# Patient Record
Sex: Female | Born: 1995 | Race: White | Hispanic: No | Smoking: Never smoker
Health system: Southern US, Community
[De-identification: ages and names within clinical notes are randomized; demographics above are authoritative.]

## PROBLEM LIST (undated history)

## (undated) DIAGNOSIS — S3981XA Other specified injuries of abdomen, initial encounter: Secondary | ICD-10-CM

## (undated) DIAGNOSIS — S060X9A Concussion with loss of consciousness of unspecified duration, initial encounter: Secondary | ICD-10-CM

## (undated) HISTORY — PX: OTHER SURGICAL HISTORY: SHX169

## (undated) HISTORY — PX: TONSILLECTOMY: SUR1361

## (undated) HISTORY — DX: Other specified injuries of abdomen, initial encounter: S39.81XA

## (undated) HISTORY — DX: Concussion with loss of consciousness of unspecified duration, initial encounter: S06.0X9A

---

## 2017-08-14 ENCOUNTER — Inpatient Hospital Stay (EMERGENCY_DEPARTMENT_HOSPITAL)
Admission: AD | Admit: 2017-08-14 | Discharge: 2017-08-15 | Disposition: A | Discharge status: Home / Self Care | Payer: BC Managed Care – PPO | Source: Ambulatory Visit | Attending: Family Medicine | Admitting: Family Medicine

## 2017-08-14 ENCOUNTER — Encounter (HOSPITAL_COMMUNITY): Payer: Self-pay | Admitting: *Deleted

## 2017-08-14 DIAGNOSIS — R102 Pelvic and perineal pain: Secondary | ICD-10-CM | POA: Diagnosis not present

## 2017-08-14 LAB — POCT PREGNANCY, URINE: Preg Test, Ur: NEGATIVE

## 2017-08-14 LAB — URINALYSIS, ROUTINE W REFLEX MICROSCOPIC
BACTERIA UA: NONE SEEN
Bilirubin Urine: NEGATIVE
Glucose, UA: NEGATIVE mg/dL
Hgb urine dipstick: NEGATIVE
Ketones, ur: NEGATIVE mg/dL
NITRITE: NEGATIVE
PH: 6 (ref 5.0–8.0)
Protein, ur: NEGATIVE mg/dL
RBC / HPF: NONE SEEN RBC/hpf (ref 0–5)
SPECIFIC GRAVITY, URINE: 1.001 — AB (ref 1.005–1.030)

## 2017-08-14 MED ORDER — IBUPROFEN 800 MG PO TABS
800.0000 mg | ORAL_TABLET | Freq: Once | ORAL | Status: AC
  Administered 2017-08-15: 800 mg via ORAL
  Filled 2017-08-14: qty 1

## 2017-08-14 MED ORDER — IBUPROFEN 600 MG PO TABS: 600.0000 mg | ORAL_TABLET | Freq: Four times a day (QID) | ORAL | 0 refills | Status: DC | PRN

## 2017-08-14 NOTE — MAU Provider Note (Signed)
  History     CSN: [id number]  Arrival date and time: 08/14/17 1856   None     Chief Complaint  Patient presents with  . Abdominal Pain   HPI Patient is a G0 with 1 month of dull aching right pelvic pain without radiation. Worse with sitting and some movements, but overall fairly mild. Pain worsened over the past three days - now intermittently sharp, but still no radiation. She does feel balloted, but no fevers, chills, nausea, vomiting. Denies new sexual partners. Denies abnormal vaginal discharge. Is about to start her period. Fiance has CF, so is pretty certain that she isn't pregnant. Not currently on OCPs - using condoms.  She just moved to the area, does not have a Gyn. Did call around to try to get into an office, but no new appointments to be seen fairly urgently.    OB History    Gravida Para Term Preterm AB Living   0 0 0 0 0 0   SAB TAB Ectopic Multiple Live Births   0 0 0 0 0      No past medical history on file.  No past surgical history on file.  No family history on file.   Social History   Tobacco Use  . Smoking status: Not on file  Substance Use Topics  . Alcohol use: Not on file  . Drug use: Not on file    Allergies: Allergies not on file  No medications prior to admission.    Review of Systems Physical Exam   Blood pressure 119/72, pulse 94, temperature 98.5 F (36.9 C), temperature source Oral, resp. rate 18, height 5\' 10"  (1.778 m), weight 140 lb 12 oz (63.8 kg), last menstrual period 07/21/2017.  Physical Exam  Constitutional: She is oriented to person, place, and time. She appears well-developed and well-nourished.  HENT:  Head: Normocephalic and atraumatic.  Right Ear: External ear normal.  Left Ear: External ear normal.  Eyes: Pupils are equal, round, and reactive to light.  Neck: Normal range of motion.  Respiratory: Effort normal. No respiratory distress.  GI: Soft. Bowel sounds are normal. She exhibits no distension and no  mass. There is tenderness (right pelvic tenderness). There is no rebound and no guarding.  McBurny's point negative. Neg Psoas sign. Neg heel strike.  Neurological: She is alert and oriented to person, place, and time. No cranial nerve deficit. She exhibits normal muscle tone. Coordination normal.  Skin: Skin is warm and dry.  Psychiatric: She has a normal mood and affect. Her behavior is normal. Judgment and thought content normal.    MAU Course  Procedures  MDM UA normal. Neg Pregnancy test. Unlikely to be appendicitis as pain not in appropriate area, no fever, nausea, etc.  Assessment and Plan  1. Right pelvic pain. Will schedule for nonemergent US to evaluate for ovarian cyst. Will also prescribe ibuprofen 600mg  q 6 hours. Follow up in office in 1 week (after US). Follow up here if pain worsens, become nauseated or if has fever.  Levie HeritageJacob J Jhana Giarratano 08/14/2017, 11:56 PM

## 2017-08-14 NOTE — Discharge Instructions (Signed)
Ovarian Cyst An ovarian cyst is a fluid-filled sac that forms on an ovary. The ovaries are small organs that produce eggs in women. Various types of cysts can form on the ovaries. Some may cause symptoms and require treatment. Most ovarian cysts go away on their own, are not cancerous (are benign), and do not cause problems. Common types of ovarian cysts include:  Functional (follicle) cysts. ? Occur during the menstrual cycle, and usually go away with the next menstrual cycle if you do not get pregnant. ? Usually cause no symptoms.  Endometriomas. ? Are cysts that form from the tissue that lines the uterus (endometrium). ? Are sometimes called "chocolate cysts" because they become filled with blood that turns brown. ? Can cause pain in the lower abdomen during intercourse and during your period.  Cystadenoma cysts. ? Develop from cells on the outside surface of the ovary. ? Can get very large and cause lower abdomen pain and pain with intercourse. ? Can cause severe pain if they twist or break open (rupture).  Dermoid cysts. ? Are sometimes found in both ovaries. ? May contain different kinds of body tissue, such as skin, teeth, hair, or cartilage. ? Usually do not cause symptoms unless they get very big.  Theca lutein cysts. ? Occur when too much of a certain hormone (human chorionic gonadotropin) is produced and overstimulates the ovaries to produce an egg. ? Are most common after having procedures used to assist with the conception of a baby (in vitro fertilization).  What are the causes? Ovarian cysts may be caused by:  Ovarian hyperstimulation syndrome. This is a condition that can develop from taking fertility medicines. It causes multiple large ovarian cysts to form.  Polycystic ovarian syndrome (PCOS). This is a common hormonal disorder that can cause ovarian cysts, as well as problems with your period or fertility.  What increases the risk? The following factors may make  you more likely to develop ovarian cysts:  Being overweight or obese.  Taking fertility medicines.  Taking certain forms of hormonal birth control.  Smoking.  What are the signs or symptoms? Many ovarian cysts do not cause symptoms. If symptoms are present, they may include:  Pelvic pain or pressure.  Pain in the lower abdomen.  Pain during sex.  Abdominal swelling.  Abnormal menstrual periods.  Increasing pain with menstrual periods.  How is this diagnosed? These cysts are commonly found during a routine pelvic exam. You may have tests to find out more about the cyst, such as:  Ultrasound.  X-ray of the pelvis.  CT scan.  MRI.  Blood tests.  How is this treated? Many ovarian cysts go away on their own without treatment. Your health care provider may want to check your cyst regularly for 2-3 months to see if it changes. If you are in menopause, it is especially important to have your cyst monitored closely because menopausal women have a higher rate of ovarian cancer. When treatment is needed, it may include:  Medicines to help relieve pain.  A procedure to drain the cyst (aspiration).  Surgery to remove the whole cyst.  Hormone treatment or birth control pills. These methods are sometimes used to help dissolve a cyst.  Follow these instructions at home:  Take over-the-counter and prescription medicines only as told by your health care provider.  Do not drive or use heavy machinery while taking prescription pain medicine.  Get regular pelvic exams and Pap tests as often as told by your health care   provider.  Return to your normal activities as told by your health care provider. Ask your health care provider what activities are safe for you.  Do not use any products that contain nicotine or tobacco, such as cigarettes and e-cigarettes. If you need help quitting, ask your health care provider.  Keep all follow-up visits as told by your health care provider.  This is important. Contact a health care provider if:  Your periods are late, irregular, or painful, or they stop.  You have pelvic pain that does not go away.  You have pressure on your bladder or trouble emptying your bladder completely.  You have pain during sex.  You have any of the following in your abdomen: ? A feeling of fullness. ? Pressure. ? Discomfort. ? Pain that does not go away. ? Swelling.  You feel generally ill.  You become constipated.  You lose your appetite.  You develop severe acne.  You start to have more body hair and facial hair.  You are gaining weight or losing weight without changing your exercise and eating habits.  You think you may be pregnant. Get help right away if:  You have abdominal pain that is severe or gets worse.  You cannot eat or drink without vomiting.  You suddenly develop a fever.  Your menstrual period is much heavier than usual. This information is not intended to replace advice given to you by your health care provider. Make sure you discuss any questions you have with your health care provider. Document Released: 09/18/2005 Document Revised: 04/07/2016 Document Reviewed: 02/20/2016 Elsevier Interactive Patient Education  2018 [employer]vier Inc[employer]  

## 2017-08-14 NOTE — MAU Note (Signed)
PT  SAYS SHE HAS RIGHT  SIDED  ABD  PAIN - STARTED ON 10-17 .  NO DR.    HAS CALLED - UNABLE  TO  GET AN APPOINTMENT  .   TOOK ALEVE AT  530PM-  FEELS  WORSE.

## 2017-08-15 ENCOUNTER — Ambulatory Visit (HOSPITAL_BASED_OUTPATIENT_CLINIC_OR_DEPARTMENT_OTHER)
Admission: RE | Admit: 2017-08-15 | Discharge: 2017-08-15 | Disposition: A | Discharge status: Home / Self Care | Payer: BC Managed Care – PPO | Source: Ambulatory Visit | Attending: Family Medicine | Admitting: Family Medicine

## 2017-08-15 ENCOUNTER — Ambulatory Visit (HOSPITAL_BASED_OUTPATIENT_CLINIC_OR_DEPARTMENT_OTHER): Payer: BC Managed Care – PPO

## 2017-08-15 ENCOUNTER — Encounter (HOSPITAL_COMMUNITY): Payer: Self-pay | Admitting: *Deleted

## 2017-08-15 DIAGNOSIS — R102 Pelvic and perineal pain: Secondary | ICD-10-CM | POA: Insufficient documentation

## 2017-08-17 ENCOUNTER — Encounter: Payer: Self-pay | Admitting: Family Medicine

## 2017-08-17 ENCOUNTER — Ambulatory Visit: Payer: BC Managed Care – PPO | Admitting: Family Medicine

## 2017-08-17 VITALS — BP 118/74 | HR 102 | Ht 70.0 in | Wt 140.0 lb

## 2017-08-17 DIAGNOSIS — R102 Pelvic and perineal pain: Secondary | ICD-10-CM | POA: Diagnosis not present

## 2017-08-17 NOTE — Progress Notes (Signed)
   Subjective:    Patient ID: Angelica Yu, female    DOB: 07/04/1996, 21 y.o.   MRN: [id number]  HPI Patient seen for follow-up of left-sided pelvic pain.  She was seen and evaluated by me in the MAU.  Ibuprofen has been helpful.  Had ultrasound yesterday had increased pelvic pain yesterday evening.  Has very mild pain this morning.  Patient to start.  The next 2-3 days.  Ultrasound reviewed: I personally reviewed the ultrasound.  Normal size uterus and ovaries.  No cyst seen.  Endometrial thickness 1.6.  Review of Systems     Objective:   Physical Exam  Constitutional: She is oriented to person, place, and time. She appears well-developed and well-nourished.  Pulmonary/Chest: Effort normal. No respiratory distress.  Abdominal: She exhibits no distension.  Neurological: She is alert and oriented to person, place, and time.  Skin: Skin is warm and dry.  Psychiatric: She has a normal mood and affect. Her behavior is normal. Judgment and thought content normal.      Assessment & Plan:  1. Pelvic pain in female No cyst on ultrasound Shows moderate pelvic fluid -likely physiologic ?  Endometriosis.  Will have patient keep a log of pain -when pain is worse, in particular in relation to cycle. Follow-up in 2 months for annual exam and revisit of pelvic pain

## 2017-08-21 NOTE — Addendum Note (Signed)
Addended by: Levie HeritageSTINSON, Keston Seever J on: 08/21/2017 01:19 PM   Modules accepted: Orders

## 2017-08-21 NOTE — Telephone Encounter (Signed)
Please refer to Dr Ayesha MohairZack Smith for pelvic pain. Order placed. Thank you!

## 2017-08-27 ENCOUNTER — Ambulatory Visit: Payer: BLUE CROSS/BLUE SHIELD | Admitting: Obstetrics & Gynecology

## 2017-09-04 NOTE — Progress Notes (Signed)
Angelica Yu D.O. Angelica Yu [address] [place], [place] [address] Phone: [phone] Subjective:    I'm seeing this patient by the request  of:    CC: Hip/back pain, right upper leg pain  BJY:[id number]:Subjective  Angelica DawleyCourtney Borunda is a 21 y.o. female coming in for pelvic pain. She has been having pain for 2 weeks. She had an US by a gynecologist which was negative. She notices pain when driving moving her foot from the gas pedal to the brake when driving. She has a burning sensation in that groin. She is not as active recently which has seemed to diminish her pain. When she lays on her left side her feels tingling on the right side. Most of her pain is on the right lower abdomen. Denies any radiating pain.   Onset- month ago Location- right lower abdomen Duration- intermittent Character- dull, sharp Aggravating factors- driving Reliving factors-  Therapies tried-  Severity-     No past medical history on file. Past Surgical History:  Procedure Laterality Date  . adnoids    . TONSILLECTOMY     Social History   Socioeconomic History  . Marital status: Significant Other    Spouse name: Not on file  . Number of children: Not on file  . Years of education: Not on file  . Highest education level: Not on file  Social Needs  . Financial resource strain: Not on file  . Food insecurity - worry: Not on file  . Food insecurity - inability: Not on file  . Transportation needs - medical: Not on file  . Transportation needs - non-medical: Not on file  Occupational History  . Not on file  Tobacco Use  . Smoking status: Never Smoker  . Smokeless tobacco: Never Used  Substance and Sexual Activity  . Alcohol use: Not on file    Comment: occ  . Drug use: No  . Sexual activity: Yes    Birth control/protection: Condom  Other Topics Concern  . Not on file  Social History Narrative  . Not on file   No Known Allergies Family History  Problem Relation Age of Onset  . Cancer  Paternal Grandfather   . Cancer Maternal Grandmother        stomach  . Breast cancer Maternal Grandmother   . Diabetes Neg Hx   . Hypertension Neg Hx      Past medical history, social, surgical and family history all reviewed in electronic medical record.  No pertanent information unless stated regarding to the chief complaint.   Review of Systems:Review of systems updated and as accurate as of 09/04/17  No headache, visual changes, nausea, vomiting, diarrhea, constipation, dizziness, abdominal pain, skin rash, fevers, chills, night sweats, weight loss, swollen lymph nodes, body aches, joint swelling, muscle aches, chest pain, shortness of breath, mood changes.   Objective  There were no vitals taken for this visit. Systems examined below as of 09/04/17   General: No apparent distress alert and oriented x3 mood and affect normal, dressed appropriately.  HEENT: Pupils equal, extraocular movements intact  Respiratory: Patient's speak in full sentences and does not appear short of breath  Cardiovascular: No lower extremity edema, non tender, no erythema  Skin: Warm dry intact with no signs of infection or rash on extremities or on axial skeleton.  Abdomen: Soft patient's tenderness seems to be in the right lower quadrant.  No rebound.  Patient does have pain mostly right where the pubic musculature in the rectus  abdominis comes together.  No bulge noted with Valsalva Neuro: Cranial nerves II through XII are intact, neurovascularly intact in all extremities with 2+ DTRs and 2+ pulses.  Lymph: No lymphadenopathy of posterior or anterior cervical chain or axillae bilaterally.  Gait normal with good balance and coordination.  MSK:  Non tender with full range of motion and good stability and symmetric strength and tone of shoulders, elbows, wrist, hip, knee and ankles bilaterally.   Limited musculoskeletal ultrasound was performed and interpreted by Angelica SaaZachary M Breylin Yu  Limited ultrasound of  patient's abdominal region does show what appears to be a neurovascular bundle that seems to be hypoechoic changes and increasing Doppler flow.  No true abdominal wall defect noted. Impression: Possible sports hernia    Impression and Recommendations:     This case required medical decision making of moderate complexity.      Note: This dictation was prepared with Dragon dictation along with smaller phrase technology. Any transcriptional errors that result from this process are unintentional.

## 2017-09-05 ENCOUNTER — Encounter: Payer: Self-pay | Admitting: Family Medicine

## 2017-09-05 ENCOUNTER — Ambulatory Visit: Payer: BC Managed Care – PPO | Admitting: Family Medicine

## 2017-09-05 DIAGNOSIS — S3981XA Other specified injuries of abdomen, initial encounter: Secondary | ICD-10-CM | POA: Diagnosis not present

## 2017-09-05 MED ORDER — PREDNISONE 50 MG PO TABS: 50.0000 mg | ORAL_TABLET | Freq: Every day | ORAL | 0 refills | Status: DC

## 2017-09-05 MED ORDER — GABAPENTIN 100 MG PO CAPS: 200.0000 mg | ORAL_CAPSULE | Freq: Every day | ORAL | 3 refills | Status: DC

## 2017-09-05 NOTE — Assessment & Plan Note (Signed)
Patient signs and symptoms seem to be more secondary to a sports hernia.  I believe the patient was doing too much core strength with no significant recovery.  Differential includes a possible hip flexor tendinitis.  Possible abscess formation but with patient intravaginally and no systemic findings I think that this is extremely low likelihood.  Do not feel that advanced imaging is necessary.  We discussed different medications and started on prednisone as well as

## 2017-09-05 NOTE — Patient Instructions (Signed)
Happy [place] OuchBirthday Likely a sports hernia Will take some time Prednisone daily for 5 days Gabapentin 1-2 pills at night if needed Only biking or elliptical with no arms Will take another 6 weeks or so See me again though in 3-4 weeks

## 2017-09-06 ENCOUNTER — Telehealth: Payer: Self-pay | Admitting: Family Medicine

## 2017-09-06 MED ORDER — MELOXICAM 15 MG PO TABS: 15.0000 mg | ORAL_TABLET | Freq: Every day | ORAL | 0 refills | Status: DC

## 2017-09-06 NOTE — Addendum Note (Signed)
Addended by: Judi SaaSMITH, Ambyr Qadri M on: 09/06/2017 01:20 PM   Modules accepted: Orders

## 2017-09-06 NOTE — Telephone Encounter (Signed)
Copied from CRM [id number]. Topic: Quick Communication - See Telephone Encounter >> Sep 06, 2017 10:50 AM Jolayne Hainesaylor, Brittany L wrote: CRM for notification. See Telephone encounter for:  Pt said she was prescribed prednisone yesterday & had a bad reaction to it. She said she seen a dr at her work this am. She took the first dose at 8am this morning. She was triaged at work. She said she does not want to take anymore of it & would like someone to call her back [phone] 09/06/17.

## 2017-09-06 NOTE — Telephone Encounter (Signed)
I will send in meloxicam for her tell her to take it for 10 days then as needed.  Please call and tell her

## 2017-09-06 NOTE — Telephone Encounter (Signed)
Pt. Reports physician at work told her to stop prednisone. She wants to know should she be taking something else. Please advise.

## 2017-10-03 NOTE — Progress Notes (Signed)
[facility] [address] [place], [place] [address] Phone: [phone] Subjective:       CC: Pelvic pain follow-up  VFI:[id number]  Angelica Yu is a 22 y.o. female coming in with complaint of low back pain follow-up.  Seem to have more of a sports hernia.  Given gabapentin, meloxicam and prednisone.  Given home exercise.  Patient states that she hasn't worked out a lot since last visit. She did try the elliptical once and that did cause a lot of pain afterwards. Her day to day pain has decreased. She has done the rehab exercises which have seemed to help. She did drive 10 hours yesterday and does have pain today. Meloxicam did seem to alleviate her pain as well but she has discontinued use until further instruction.        No past medical history on file. Past Surgical History:  Procedure Laterality Date  . adnoids    . TONSILLECTOMY     Social History   Socioeconomic History  . Marital status: Significant Other    Spouse name: None  . Number of children: None  . Years of education: None  . Highest education level: None  Social Needs  . Financial resource strain: None  . Food insecurity - worry: None  . Food insecurity - inability: None  . Transportation needs - medical: None  . Transportation needs - non-medical: None  Occupational History  . None  Tobacco Use  . Smoking status: Never Smoker  . Smokeless tobacco: Never Used  Substance and Sexual Activity  . Alcohol use: None    Comment: occ  . Drug use: No  . Sexual activity: Yes    Birth control/protection: Condom  Other Topics Concern  . None  Social History Narrative  . None   No Known Allergies Family History  Problem Relation Age of Onset  . Cancer Paternal Grandfather   . Cancer Maternal Grandmother        stomach  . Breast cancer Maternal Grandmother   . Diabetes Neg Hx   . Hypertension Neg Hx      Past medical history, social, surgical and family history all  reviewed in electronic medical record.  No pertanent information unless stated regarding to the chief complaint.   Review of Systems:Review of systems updated and as accurate as of 10/04/17  No headache, visual changes, nausea, vomiting, diarrhea, constipation, dizziness, abdominal pain, skin rash, fevers, chills, night sweats, weight loss, swollen lymph nodes, body aches, joint swelling,  chest pain, shortness of breath, mood changes.  Positive muscle aches  Objective  Blood pressure 108/74, pulse 98, height 5\' 10"  (1.778 m), weight 141 lb (64 kg), SpO2 98 %. Systems examined below as of 10/04/17   General: No apparent distress alert and oriented x3 mood and affect normal, dressed appropriately.  HEENT: Pupils equal, extraocular movements intact  Respiratory: Patient's speak in full sentences and does not appear short of breath  Cardiovascular: No lower extremity edema, non tender, no erythema  Skin: Warm dry intact with no signs of infection or rash on extremities or on axial skeleton.  Abdomen: Soft patient is still tender on the right side where the rectus abdominis and the oblique muscles come together.  Negative rebound.  Negative pain to percussion.  No masses appreciated Neuro: Cranial nerves II through XII are intact, neurovascularly intact in all extremities with 2+ DTRs and 2+ pulses.  Lymph: No lymphadenopathy of posterior or anterior cervical chain or axillae  bilaterally.  Gait normal with good balance and coordination.  MSK:  Non tender with full range of motion and good stability and symmetric strength and tone of shoulders, elbows, wrist, hip, knee and ankles bilaterally.   Limited musculoskeletal ultrasound was performed and interpreted by Judi SaaZachary M Smith  Limited ultrasound to the area where the likely sports hernia was previously does show significant decrease in hypoechoic changes and Doppler flow that was seen previously.  With Valsalva no neurovascular bundle seems to go  within the muscle region. Impression: Improvement from previous exam     Impression and Recommendations:     This case required medical decision making of moderate complexity.      Note: This dictation was prepared with Dragon dictation along with smaller phrase technology. Any transcriptional errors that result from this process are unintentional.

## 2017-10-04 ENCOUNTER — Ambulatory Visit: Payer: Self-pay

## 2017-10-04 ENCOUNTER — Encounter: Payer: Self-pay | Admitting: Family Medicine

## 2017-10-04 ENCOUNTER — Ambulatory Visit: Payer: BLUE CROSS/BLUE SHIELD | Admitting: Family Medicine

## 2017-10-04 VITALS — BP 108/74 | HR 98 | Ht 70.0 in | Wt 141.0 lb

## 2017-10-04 DIAGNOSIS — S3981XD Other specified injuries of abdomen, subsequent encounter: Secondary | ICD-10-CM

## 2017-10-04 NOTE — Assessment & Plan Note (Signed)
Patient seems to be making some improvement.  Has had already other pelvic pathology.  Patient is about 70% better at this time.  Encouraged to take the meloxicam as needed and increase activity slowly.  We discussed the possibility of the gabapentin at bedtime which she has not been taking.  We discussed icing regimen.  Patient does not want to start the nitroglycerin.  Follow-up again in 4 weeks

## 2017-10-04 NOTE — Patient Instructions (Signed)
Good news overall  Ice is your friend when needed Ok elliptical, biking and swimming No running or jumping Increase our exercises to 4-5 times a week  About a month out from everything If any worsening pain do the meloxicam daily for 5 days then discontinue.  See me again in 4 weeks

## 2017-10-23 DIAGNOSIS — S060X9A Concussion with loss of consciousness of unspecified duration, initial encounter: Secondary | ICD-10-CM

## 2017-10-23 DIAGNOSIS — S060XAA Concussion with loss of consciousness status unknown, initial encounter: Secondary | ICD-10-CM

## 2017-10-23 HISTORY — DX: Concussion with loss of consciousness of unspecified duration, initial encounter: S06.0X9A

## 2017-10-23 HISTORY — DX: Concussion with loss of consciousness status unknown, initial encounter: S06.0XAA

## 2017-10-31 NOTE — Progress Notes (Signed)
Tawana ScaleZach Mackay Hanauer D.O. [facility] [address] [place], [place] [address] Phone: [phone] Subjective:    I'm seeing this patient by the request  of:    CC: abdominal pain follow up   BJY:NWGNFAOZHYHPI:Subjective  Angelica DawleyCourtney Yu is a 22 y.o. female coming in with complaint of leg and abdominal pain.  Found to have what appeared to be more of a sports hernia.  Patient was having less dated a pain and seemed to be improving but very slowly.  Patient states doing much better at this time.  95% better.  Not noticing any pain and regular exercises.     No past medical history on file. Past Surgical History:  Procedure Laterality Date  . adnoids    . TONSILLECTOMY     Social History   Socioeconomic History  . Marital status: Significant Other    Spouse name: Not on file  . Number of children: Not on file  . Years of education: Not on file  . Highest education level: Not on file  Social Needs  . Financial resource strain: Not on file  . Food insecurity - worry: Not on file  . Food insecurity - inability: Not on file  . Transportation needs - medical: Not on file  . Transportation needs - non-medical: Not on file  Occupational History  . Not on file  Tobacco Use  . Smoking status: Never Smoker  . Smokeless tobacco: Never Used  Substance and Sexual Activity  . Alcohol use: Not on file    Comment: occ  . Drug use: No  . Sexual activity: Yes    Birth control/protection: Condom  Other Topics Concern  . Not on file  Social History Narrative  . Not on file   No Known Allergies Family History  Problem Relation Age of Onset  . Cancer Paternal Grandfather   . Cancer Maternal Grandmother        stomach  . Breast cancer Maternal Grandmother   . Diabetes Neg Hx   . Hypertension Neg Hx      Past medical history, social, surgical and family history all reviewed in electronic medical record.  No pertanent information unless stated regarding to the chief complaint.   Review of  Systems:Review of systems updated and as accurate as of 11/01/17  No  visual changes, nausea, vomiting, diarrhea, constipation, dizziness, abdominal pain, skin rash, fevers, chills, night sweats, weight loss, swollen lymph nodes, body aches, joint swelling, muscle aches, chest pain, shortness of breath, mood changes.  Positive headache but being treated for concussion from an outside facility.  Objective  Blood pressure 130/70, height 5\' 10"  (1.778 m), weight 141 lb (64 kg). Systems examined below as of 11/01/17   General: No apparent distress alert and oriented x3 mood and affect normal, dressed appropriately.  HEENT: Pupils equal, extraocular movements intact nystagmus noted. Respiratory: Patient's speak in full sentences and does not appear short of breath  Cardiovascular: No lower extremity edema, non tender, no erythema  Skin: Warm dry intact with no signs of infection or rash on extremities or on axial skeleton.  Abdomen: Soft nontender  Neuro: Cranial nerves II through XII are intact, neurovascularly intact in all extremities with 2+ DTRs and 2+ pulses.  Lymph: No lymphadenopathy of posterior or anterior cervical chain or axillae bilaterally.  Gait normal with good balance and coordination.  MSK:  Non tender with full range of motion and good stability and symmetric strength and tone of shoulders, elbows, wrist, hip, knee  and ankles bilaterally.     Impression and Recommendations:     This case required medical decision making of moderate complexity.      Note: This dictation was prepared with Dragon dictation along with smaller phrase technology. Any transcriptional errors that result from this process are unintentional.

## 2017-11-01 ENCOUNTER — Ambulatory Visit: Payer: BLUE CROSS/BLUE SHIELD | Admitting: Family Medicine

## 2017-11-01 ENCOUNTER — Encounter: Payer: Self-pay | Admitting: Family Medicine

## 2017-11-01 DIAGNOSIS — S3981XA Other specified injuries of abdomen, initial encounter: Secondary | ICD-10-CM

## 2017-11-01 NOTE — Assessment & Plan Note (Signed)
Much improved at this time.  No significant pain.  Discussed icing regimen and home exercises.  Patient will follow up with me again 6 weeks if not completely resolved exercise progression given

## 2017-11-01 NOTE — Patient Instructions (Signed)
Good to see you  Fish oil 3 grams daily for 10 days and then 2 grams daily thereafter Tart cherry extract for nighttime and help sleep  Vitamin D 2000 IU daily  CoQ10 400mg  daily

## 2017-11-08 ENCOUNTER — Encounter: Payer: Self-pay | Admitting: Family Medicine

## 2017-11-08 ENCOUNTER — Ambulatory Visit (INDEPENDENT_AMBULATORY_CARE_PROVIDER_SITE_OTHER): Payer: BLUE CROSS/BLUE SHIELD | Admitting: Family Medicine

## 2017-11-08 VITALS — BP 123/77 | HR 106 | Ht 70.0 in | Wt 143.0 lb

## 2017-11-08 DIAGNOSIS — Z113 Encounter for screening for infections with a predominantly sexual mode of transmission: Secondary | ICD-10-CM

## 2017-11-08 DIAGNOSIS — Z124 Encounter for screening for malignant neoplasm of cervix: Secondary | ICD-10-CM

## 2017-11-08 DIAGNOSIS — Z01419 Encounter for gynecological examination (general) (routine) without abnormal findings: Secondary | ICD-10-CM | POA: Diagnosis not present

## 2017-11-08 NOTE — Progress Notes (Signed)
GYNECOLOGY ANNUAL PREVENTATIVE CARE ENCOUNTER NOTE  Subjective:   Angelica Yu is a 22 y.o. G0P0000 female here for a routine annual gynecologic exam.  Current complaints: none.   Denies abnormal vaginal bleeding, discharge, pelvic pain, problems with intercourse or other gynecologic concerns.    Patient's partner has CF - wants to look into testing. Information given.  Gynecologic History Patient's last menstrual period was 10/21/2017. Patient is sexually active  Contraception: condoms Last Pap: n/a. Last mammogram: n/a.  Obstetric History OB History  Gravida Para Term Preterm AB Living  0 0 0 0 0 0  SAB TAB Ectopic Multiple Live Births  0 0 0 0 0        Past Medical History:  Diagnosis Date  . Sports hernia     Past Surgical History:  Procedure Laterality Date  . adnoids    . TONSILLECTOMY      Current Outpatient Medications on File Prior to Visit  Medication Sig Dispense Refill  . ibuprofen (ADVIL,MOTRIN) 600 MG tablet Take 1 tablet (600 mg total) every 6 (six) hours as needed by mouth. 30 tablet 0  . meloxicam (MOBIC) 15 MG tablet Take 1 tablet (15 mg total) by mouth daily. 30 tablet 0  . gabapentin (NEURONTIN) 100 MG capsule Take 2 capsules (200 mg total) by mouth at bedtime. (Patient not taking: Reported on 11/01/2017) 60 capsule 3  . predniSONE (DELTASONE) 50 MG tablet Take 1 tablet (50 mg total) by mouth daily. (Patient not taking: Reported on 11/01/2017) 5 tablet 0   No current facility-administered medications on file prior to visit.     No Known Allergies  Social History   Socioeconomic History  . Marital status: Significant Other    Spouse name: Not on file  . Number of children: Not on file  . Years of education: Not on file  . Highest education level: Not on file  Social Needs  . Financial resource strain: Not on file  . Food insecurity - worry: Not on file  . Food insecurity - inability: Not on file  . Transportation needs - medical:  Not on file  . Transportation needs - non-medical: Not on file  Occupational History  . Not on file  Tobacco Use  . Smoking status: Never Smoker  . Smokeless tobacco: Never Used  Substance and Sexual Activity  . Alcohol use: Not on file    Comment: occ  . Drug use: No  . Sexual activity: Yes    Birth control/protection: Condom  Other Topics Concern  . Not on file  Social History Narrative  . Not on file    Family History  Problem Relation Age of Onset  . Cancer Paternal Grandfather   . Cancer Maternal Grandmother        stomach  . Breast cancer Maternal Grandmother   . Diabetes Neg Hx   . Hypertension Neg Hx     The following portions of the patient's history were reviewed and updated as appropriate: allergies, current medications, past family history, past medical history, past social history, past surgical history and problem list.  Review of Systems Pertinent items noted in HPI and remainder of comprehensive ROS otherwise negative.   Objective:  BP 123/77   Pulse (!) 106   Ht 5\' 10"  (1.778 m)   Wt 143 lb (64.9 kg)   LMP 10/21/2017   BMI 20.52 kg/m  CONSTITUTIONAL: Well-developed, well-nourished female in no acute distress.  HENT:  Normocephalic, atraumatic, External right and left  ear normal. Oropharynx is clear and moist EYES: Conjunctivae and EOM are normal. Pupils are equal, round, and reactive to light. No scleral icterus.  NECK: Normal range of motion, supple, no masses.  Normal thyroid.   CARDIOVASCULAR: Normal heart rate noted, regular rhythm RESPIRATORY: Clear to auscultation bilaterally. Effort and breath sounds normal, no problems with respiration noted. BREASTS: Symmetric in size. No masses, skin changes, nipple drainage, or lymphadenopathy. ABDOMEN: Soft, normal bowel sounds, no distention noted.  No tenderness, rebound or guarding.  PELVIC: Normal appearing external genitalia; normal appearing vaginal mucosa and cervix.  No abnormal discharge noted.   Pap smear obtained.  Normal uterine size, no other palpable masses, no uterine or adnexal tenderness. MUSCULOSKELETAL: Normal range of motion. No tenderness.  No cyanosis, clubbing, or edema.  2+ distal pulses. SKIN: Skin is warm and dry. No rash noted. Not diaphoretic. No erythema. No pallor. NEUROLOGIC: Alert and oriented to person, place, and time. Normal reflexes, muscle tone coordination. No cranial nerve deficit noted. PSYCHIATRIC: Normal mood and affect. Normal behavior. Normal judgment and thought content.  Assessment:  Annual gynecologic examination with pap smear   Plan:  Will follow up results of pap smear and manage accordingly. STD testing discussed. Patient declined testing Discussed exercise and diet   Routine preventative health maintenance measures emphasized. Please refer to After Visit Summary for other counseling recommendations.    Candelaria CelesteJacob Obdulia Steier, DO [facility]

## 2017-11-08 NOTE — Patient Instructions (Signed)

## 2017-11-12 LAB — CYTOLOGY - PAP
CHLAMYDIA, DNA PROBE: NEGATIVE
DIAGNOSIS: NEGATIVE
NEISSERIA GONORRHEA: NEGATIVE

## 2017-11-13 ENCOUNTER — Encounter: Payer: Self-pay | Admitting: Family Medicine

## 2017-12-13 ENCOUNTER — Ambulatory Visit: Payer: BLUE CROSS/BLUE SHIELD | Admitting: Family Medicine

## 2018-01-01 ENCOUNTER — Encounter
Payer: No Typology Code available for payment source | Attending: Physical Medicine & Rehabilitation | Admitting: Physical Medicine & Rehabilitation

## 2018-01-01 ENCOUNTER — Encounter: Payer: Self-pay | Admitting: Physical Medicine & Rehabilitation

## 2018-01-01 DIAGNOSIS — G479 Sleep disorder, unspecified: Secondary | ICD-10-CM | POA: Insufficient documentation

## 2018-01-01 DIAGNOSIS — H8113 Benign paroxysmal vertigo, bilateral: Secondary | ICD-10-CM | POA: Diagnosis not present

## 2018-01-01 DIAGNOSIS — R51 Headache: Secondary | ICD-10-CM | POA: Insufficient documentation

## 2018-01-01 DIAGNOSIS — G44309 Post-traumatic headache, unspecified, not intractable: Secondary | ICD-10-CM

## 2018-01-01 DIAGNOSIS — F0781 Postconcussional syndrome: Secondary | ICD-10-CM

## 2018-01-01 DIAGNOSIS — H8110 Benign paroxysmal vertigo, unspecified ear: Secondary | ICD-10-CM | POA: Diagnosis not present

## 2018-01-01 MED ORDER — TOPIRAMATE 25 MG PO TABS: 25.0000 mg | ORAL_TABLET | Freq: Every day | ORAL | 2 refills | Status: DC

## 2018-01-01 NOTE — Patient Instructions (Signed)
TOPAMAX AT NIGHT.   GOAL ----10 HOURS OF SLEEP   YOU MAY CONTINUE TO WORK 4 HOURS PER DAY.      PLEASE FEEL FREE TO CALL OUR OFFICE WITH ANY PROBLEMS OR QUESTIONS [phone])

## 2018-01-01 NOTE — Progress Notes (Signed)
Subjective:    Patient ID: Angelica Yu, female    DOB: 11/24/1995, 22 y.o.   MRN: [id number]  HPI   Angelica Yu is here for an initial evaluation of post-concussion syndrome. On 10/23/17 she was bending over underneath a cabinet at work.  When she stood up she hit her head forcibly along the underside of the cabinet door striking the area behind her right ear along the temporal occipital region.  She did not lose consciousness but did have an ongoing headache and felt off balance.  She was seen by the medical team at [facility] and conservative care was recommended.  She was given meclizine for vertigo which she took briefly.  No imaging was done of her brain.  She was showing gradual improvement with her work hours being slowly increased over the last few weeks. 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 Since that time she feels as if she has regressed, often being unable to perform simple tasks at home due to headaches and dizziness, fatigue. Her dizziness has really has been intermittent since the original accident with occasional associated nausea, especially early on.  Also of note, she feels a echoing/fullness in her right ear since the injury. She has also noticed photo-sensitivity and can't drive at night. Diplopia is better.  Her right scalp behind her ear also remains tender  She has also noticed more difficulties with concentration, focus. She is not processing as quickly as she once did.  She notes that she is able to maintain the responsibilities of her job and that others have not noticed her deficits.  Her sleep has been a problem, often due to her headaches.  She has more recently had problems falling to sleep. She sleeps around 9 hours but doesn't feel that she has much energy during the day.  She is having to take naps during the day to help get herself through.   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    Pain Inventory Average Pain 4 Pain Right Now 7 My pain is sharp and stabbing  In the last 24 hours, has pain interfered with the following? General activity 8 Relation with others 2 Enjoyment of life 6 What TIME of day is your pain at its worst? evening Sleep (in general) Good  Pain is worse with: na Pain improves with: rest Relief from Meds: 0  Mobility walk without assistance ability to climb steps?  yes do you drive?  yes  Function employed # of hrs/week 40  Neuro/Psych tingling dizziness confusion anxiety  Prior Studies Any changes since last visit?  no  Physicians involved in your care Any changes since last visit?  no   Family History  Problem Relation Age of Onset  . Cancer Paternal Grandfather   . Cancer Maternal Grandmother        stomach  . Breast cancer Maternal Grandmother   . Diabetes Neg Hx   . Hypertension Neg Hx    Social History   Socioeconomic History  . Marital status: Significant Other    Spouse name: Not on file  . Number of children: Not on file  . Years of education: Not on file  . Highest education level: Not on file  Occupational History  . Not on file  Social Needs  .  Financial resource strain: Not on file  . Food insecurity:    Worry: Not on file    Inability: Not on file  . Transportation needs:    Medical: Not on file    Non-medical: Not on file  Tobacco Use  . Smoking status: Never Smoker  . Smokeless tobacco: Never Used  Substance and Sexual Activity  . Alcohol use: Not Currently  . Drug use: No  . Sexual activity: Yes    Birth control/protection: Condom  Lifestyle  . Physical activity:    Days per week: Not on file    Minutes per session: Not on file  . Stress: Not on file  Relationships  . Social connections:    Talks on phone: Not on file    Gets together: Not on file    Attends religious  service: Not on file    Active member of club or organization: Not on file    Attends meetings of clubs or organizations: Not on file    Relationship status: Not on file  Other Topics Concern  . Not on file  Social History Narrative  . Not on file   Past Surgical History:  Procedure Laterality Date  . adnoids    . TONSILLECTOMY     Past Medical History:  Diagnosis Date  . Sports hernia    BP 123/87   Pulse (!) 125   Ht 5\' 10"  (1.778 m) Comment: states  Wt 140 lb (63.5 kg) Comment: states  LMP 12/19/2017   SpO2 98%   BMI 20.09 kg/m   Opioid Risk Score:   Fall Risk Score:  `1  Depression screen PHQ 2/9  No flowsheet data found.   Review of Systems  Constitutional: Negative.   HENT: Negative.   Eyes: Negative.   Respiratory: Negative.   Cardiovascular: Negative.   Gastrointestinal: Negative.   Endocrine: Negative.   Genitourinary: Negative.   Musculoskeletal: Negative.   Skin: Negative.   Allergic/Immunologic: Negative.   Neurological: Positive for dizziness and headaches.  Hematological: Negative.   Psychiatric/Behavioral: Negative.   All other systems reviewed and are negative.      Objective:   Physical Exam  General: Alert and oriented x 3, No apparent distress.  Perhaps a bit photosensitive HEENT: Head is normocephalic, atraumatic, PERRLA, EOMI, sclera anicteric, oral mucosa pink and moist, dentition intact, right ext ear canal clear,  Neck: Supple without JVD or lymphadenopathy Heart: Reg rate and rhythm. No murmurs rubs or gallops Chest: CTA bilaterally without wheezes, rales, or rhonchi; no distress Abdomen: Soft, non-tender, non-distended, bowel sounds positive. Extremities: No clubbing, cyanosis, or edema. Pulses are 2+ Skin: Clean and intact without signs of breakdown Neuro: Patient displays intact insight and awareness and functional attention.  She recalled 3 out of 3 words after 5 minutes.  She is able to sequence numbers without too much  difficulty.  She is aware of current events and abstract thinking was appropriate.  She is able to spell the word world forward and backwards.  He she did struggle a bit with serial sevens missing 1 out of the 3 numbers.  During visual confrontation and Hallpike Dix maneuvers I elicited nystagmus to the right and to the left.  Fine motor coordination was intact although Romberg testing was positive for posterior lean.. Sensory exam is normal. Reflexes are 2+ in all 4's.  No tremors. Motor function is grossly 5/5.  Gait was fairly normal Musculoskeletal: Full ROM, No pain with AROM or PROM in the neck, trunk,  or extremities. Posture appropriate Psych: Pt's affect is appropriate. Pt is cooperative.  Perhaps a little anxious but very upbeat overall.         Assessment & Plan:  1. 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 No LOC   Plan: 1.  [facility] for vestibular assessment and treatment.  She should respond nicely to this. 2.  For headaches, will begin on Topamax 25 mg nightly.  Can increase further if needed.  If she has breakthrough headaches can consider medication to manage these as well. 3.  Discussed restoration of her sleep cycle.  Her normal sleep is about 9 hours.  She probably needs to target at least 10 to help maximize her function during the day. 4.  Can consider a formal speech therapy evaluation for higher level cognition.  In the meantime, however, she will work on some strategies to help maintain organization and focus during the day.  Along those lines I recommended that she only work 4 hours/day at this point until some of her symptoms improve. 5.  Overall she should do nicely.  I want her to continue acclimating herself from a cognitive and physical standpoint.  She has not had an MRI or CAT scan of her brain.  However,  at this point such as scan would not change the plan of care. 6.  I will see her back in about 1 month's time.  Today I spent 45 minutes in direct patient care.  All questions were encouraged and answered.

## 2018-01-02 ENCOUNTER — Ambulatory Visit: Payer: BLUE CROSS/BLUE SHIELD | Admitting: Nurse Practitioner

## 2018-01-02 ENCOUNTER — Encounter: Payer: Self-pay | Admitting: Nurse Practitioner

## 2018-01-02 VITALS — BP 125/72 | HR 106 | Temp 97.7°F | Ht 70.0 in | Wt 141.4 lb

## 2018-01-02 DIAGNOSIS — Z0000 Encounter for general adult medical examination without abnormal findings: Secondary | ICD-10-CM

## 2018-01-02 DIAGNOSIS — Z23 Encounter for immunization: Secondary | ICD-10-CM

## 2018-01-02 LAB — COMPREHENSIVE METABOLIC PANEL
ALT: 15 U/L (ref 0–35)
AST: 21 U/L (ref 0–37)
Albumin: 4.4 g/dL (ref 3.5–5.2)
Alkaline Phosphatase: 52 U/L (ref 39–117)
BUN: 11 mg/dL (ref 6–23)
CHLORIDE: 102 meq/L (ref 96–112)
CO2: 29 meq/L (ref 19–32)
CREATININE: 0.76 mg/dL (ref 0.40–1.20)
Calcium: 9.9 mg/dL (ref 8.4–10.5)
GFR: 99.96 mL/min (ref >60.00)
Glucose, Bld: 93 mg/dL (ref 70–99)
POTASSIUM: 5 meq/L (ref 3.5–5.1)
SODIUM: 138 meq/L (ref 135–145)
Total Bilirubin: 0.7 mg/dL (ref 0.2–1.2)
Total Protein: 7.2 g/dL (ref 6.0–8.3)

## 2018-01-02 LAB — CBC
HEMATOCRIT: 42.6 % (ref 36.0–46.0)
Hemoglobin: 14.6 g/dL (ref 12.0–15.0)
MCHC: 34.3 g/dL (ref 30.0–36.0)
MCV: 89.9 fl (ref 78.0–100.0)
Platelets: 245 10*3/uL (ref 150.0–400.0)
RBC: 4.74 Mil/uL (ref 3.87–5.11)
RDW: 12.8 % (ref 11.5–15.5)
WBC: 5.6 10*3/uL (ref 4.0–10.5)

## 2018-01-02 LAB — TSH: TSH: 1.03 u[IU]/mL (ref 0.35–4.50)

## 2018-01-02 NOTE — Patient Instructions (Signed)
Go to lab for blood draw  You will be called with results.   Preventive Care for [place], Female The transition to life after high school as a young adult can be a stressful time with many changes. You may start seeing a primary care physician instead of a pediatrician. This is the time when your health care becomes your responsibility. Preventive care refers to lifestyle choices and visits with your health care provider that can promote health and wellness. What does preventive care include?  A yearly physical exam. This is also called an annual wellness visit.  Dental exams once or twice a year.  Routine eye exams. Ask your health care provider how often you should have your eyes checked.  Personal lifestyle choices, including: ? Daily care of your teeth and gums. ? Regular physical activity. ? Eating a healthy diet. ? Avoiding tobacco and drug use. ? Avoiding or limiting alcohol use. ? Practicing safe sex. ? Taking vitamin and mineral supplements as recommended by your health care provider. What happens during an annual wellness visit? Preventive care starts with a yearly visit to your primary care physician. The services and screenings done by your health care provider during your annual wellness visit will depend on your overall health, lifestyle risk factors, and family history of disease. Counseling Your health care provider may ask you questions about:  Past medical problems and your family's medical history.  Medicines or supplements you take.  Health insurance and access to health care.  Alcohol, tobacco, and drug use.  Your safety at home, work, or school.  Access to firearms.  Emotional well-being and how you cope with stress.  Relationship well-being.  Diet, exercise, and sleep habits.  Your sexual health and activity.  Your methods of birth control.  Your menstrual cycle.  Your pregnancy history.  Screening You may have the following tests or  measurements:  Height, weight, and BMI.  Blood pressure.  Lipid and cholesterol levels.  Tuberculosis skin test.  Skin exam.  Vision and hearing tests.  Screening test for hepatitis.  Screening tests for sexually transmitted diseases (STDs), if you are at risk.  BRCA-related cancer screening. This may be done if you have a family history of breast, ovarian, tubal, or peritoneal cancers.  Pelvic exam and Pap test. This may be done every 3 years starting at age 38.  Vaccines Your health care provider may recommend certain vaccines, such as:  Influenza vaccine. This is recommended every year.  Tetanus, diphtheria, and acellular pertussis (Tdap, Td) vaccine. You may need a Td booster every 10 years.  Varicella vaccine. You may need this if you have not been vaccinated.  HPV vaccine. If you are 89 or younger, you may need three doses over 6 months.  Measles, mumps, and rubella (MMR) vaccine. You may need at least one dose of MMR. You may also need a second dose.  Pneumococcal 13-valent conjugate (PCV13) vaccine. You may need this if you have certain conditions and were not previously vaccinated.  Pneumococcal polysaccharide (PPSV23) vaccine. You may need one or two doses if you smoke cigarettes or if you have certain conditions.  Meningococcal vaccine. One dose is recommended if you are age 50-21 years and a first-year college student living in a residence hall, or if you have one of several medical conditions. You may also need additional booster doses.  Hepatitis A vaccine. You may need this if you have certain conditions or if you travel or work in places where you  may be exposed to hepatitis A.  Hepatitis B vaccine. You may need this if you have certain conditions or if you travel or work in places where you may be exposed to hepatitis B.  Haemophilus influenzae type b (Hib) vaccine. You may need this if you have certain risk factors.  Talk to your health care provider  about which screenings and vaccines you need and how often you need them. What steps can I take to develop healthy behaviors?  Have regular preventive health care visits with your primary care physician and dentist.  Eat a healthy diet.  Drink enough fluid to keep your urine clear or pale yellow.  Stay active. Exercise at least 30 minutes 5 or more days of the week.  Use alcohol responsibly.  Maintain a healthy weight.  Do not use any products that contain nicotine, such as cigarettes, chewing tobacco, and e-cigarettes. If you need help quitting, ask your health care provider.  Do not use drugs.  Practice safe sex.  Use birth control (contraception) to prevent unwanted pregnancy. If you plan to become pregnant, see your health care provider for a pre-conception visit.  Find healthy ways to manage stress. How can I protect myself from injury? Injuries from violence or accidents are the leading cause of death among young adults and can often be prevented. Take these steps to help protect yourself:  Always wear your seat belt while driving or riding in a vehicle.  Do not drive if you have been drinking alcohol. Do not ride with someone who has been drinking.  Do not drive when you are tired or distracted. Do not text while driving.  Wear a helmet and other protective equipment during sports activities.  If you have firearms in your house, make sure you follow all gun safety procedures.  Seek help if you have been bullied, physically abused, or sexually abused.  Use the Internet responsibly to avoid dangers such as online bullying and online sexual predators.  What can I do to cope with stress? Young adults may face many new challenges that can be stressful, such as finding a job, going to college, moving away from home, managing money, being in a relationship, getting married, and having children. To manage stress:  Avoid known stressful situations when you can.  Exercise  regularly.  Find a stress-reducing activity that works best for you. Examples include meditation, yoga, listening to music, or reading.  Spend time in nature.  Keep a journal to write about your stress and how you respond.  Talk to your health care provider about stress. He or she may suggest counseling.  Spend time with supportive friends or family.  Do not cope with stress by: ? Drinking alcohol or using drugs. ? Smoking cigarettes. ? Eating.  Where can I get more information? Learn more about preventive care and healthy habits from:  [place] and Gynecologists: KaraokeExchange.nl  U.S. Probation officer Task Force: StageSync.si  National Adolescent and [place]: StrategicRoad.nl  American Academy of Pediatrics Bright Futures: https://brightfutures.MemberVerification.co.za  Society for Adolescent Health and Medicine: MoralBlog.co.za.aspx  PodExchange.nl: ToyLending.fr  This information is not intended to replace advice given to you by your health care provider. Make sure you discuss any questions you have with your health care provider. Document Released: 02/03/2016 Document Revised: 02/24/2016 Document Reviewed: 02/03/2016 Elsevier Interactive Patient Education  [employer].

## 2018-01-02 NOTE — Progress Notes (Signed)
Subjective:  Patient ID: Angelica Dawleyourtney Schamp, female    DOB: 01/28/1996  Age: 22 y.o. MRN: [id number]  CC: Establish Care (est care. would just like to set up care. new to area. Tdap? just had GYN and well visit 2.9.19.)  HPI  Ms. Angelica ReamerShreve is here to establish care and for CPE. 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 She is engaged and no children at this time. Home with Fiance (patrick). Sexually active with use of condoms.  Vision: up to date, done annually.  Dental: up to date, done every 6months.  PAP: up to date.  Outpatient Medications Prior to Visit  Medication Sig Dispense Refill  . Cholecalciferol (VITAMIN D) 2000 units CAPS Take by mouth.    . Multiple Vitamin (MULTIVITAMIN WITH MINERALS) TABS tablet Take 1 tablet by mouth daily.    Marland Kitchen. topiramate (TOPAMAX) 25 MG tablet Take 1 tablet (25 mg total) by mouth at bedtime. 30 tablet 2   No facility-administered medications prior to visit.    Social History   Socioeconomic History  . Marital status: Significant Other    Spouse name: Not on file  . Number of children: Not on file  . Years of education: Not on file  . Highest education level: Not on file  Occupational History  . Not on file  Social Needs  . Financial resource strain: Not on file  . Food insecurity:    Worry: Not on file    Inability: Not on file  . Transportation needs:    Medical: Not on file    Non-medical: Not on file  Tobacco Use  . Smoking status: Never Smoker  . Smokeless tobacco: Never Used  Substance and Sexual Activity  . Alcohol use: Not Currently  . Drug use: No  . Sexual activity: Yes    Birth control/protection: Condom  Lifestyle  . Physical activity:    Days per week: Not on file    Minutes per session: Not on file  . Stress: Not on file  Relationships  . Social connections:    Talks on phone: Not on file    Gets together: Not on file    Attends religious service: Not on file    Active member of club  or organization: Not on file    Attends meetings of clubs or organizations: Not on file    Relationship status: Not on file  . Intimate partner violence:    Fear of current or ex partner: Not on file    Emotionally abused: Not on file    Physically abused: Not on file    Forced sexual activity: Not on file  Other Topics Concern  . Not on file  Social History Narrative  . Not on file   Family History  Problem Relation Age of Onset  . Cancer Paternal Grandfather   . Alcohol abuse Paternal Grandfather   . Cancer Maternal Grandmother        stomach  . Breast cancer Maternal Grandmother [id number]  . Early death Maternal Grandfather   . Kidney disease Paternal Grandmother   . Diabetes Neg Hx   . Hypertension Neg Hx    ROS Review of Systems  Constitutional: Negative for fever, malaise/fatigue and weight loss.  HENT: Negative for congestion and sore throat.   Eyes:       Negative for visual changes  Respiratory: Negative for cough and shortness of breath.   Cardiovascular: Negative for chest pain, palpitations and  leg swelling.  Gastrointestinal: Negative for blood in stool, constipation, diarrhea and heartburn.  Genitourinary: Negative for dysuria, frequency and urgency.  Musculoskeletal: Negative for falls, joint pain and myalgias.  Skin: Negative for rash.  Neurological: Positive for dizziness. Negative for sensory change and headaches.       Persistent intermittent dizziness with movement post concussion. ongoing management by neurology. Reports she has been referred for vestibular rehab.  Endo/Heme/Allergies: Does not bruise/bleed easily.  Psychiatric/Behavioral: Negative for depression, substance abuse and suicidal ideas. The patient is not nervous/anxious.      Objective:  BP 125/72 (BP Location: Left Arm, Patient Position: Sitting, Cuff Size: Normal)   Pulse (!) 106   Temp 97.7 F (36.5 C) (Oral)   Ht 5\' 10"  (1.778 m)   Wt 141 lb 6.4 oz (64.1 kg)   LMP 12/19/2017   SpO2  97%   BMI 20.29 kg/m   BP Readings from Last 3 Encounters:  01/02/18 125/72  01/01/18 118/85  11/08/17 123/77    Wt Readings from Last 3 Encounters:  01/02/18 141 lb 6.4 oz (64.1 kg)  01/01/18 140 lb (63.5 kg)  11/08/17 143 lb (64.9 kg)    Physical Exam  Constitutional: She is oriented to person, place, and time. No distress.  HENT:  Right Ear: External ear normal.  Left Ear: External ear normal.  Nose: Nose normal.  Mouth/Throat: No oropharyngeal exudate.  Eyes: No scleral icterus.  Neck: Normal range of motion. Neck supple.  Cardiovascular: Normal rate, regular rhythm and normal heart sounds.  Pulmonary/Chest: Effort normal and breath sounds normal. No respiratory distress.  Abdominal: Soft. She exhibits no distension.  Genitourinary:  Genitourinary Comments: Breast and pelvic exam defered to GYN  Musculoskeletal: Normal range of motion. She exhibits no edema.  Lymphadenopathy:    She has no cervical adenopathy.  Neurological: She is alert and oriented to person, place, and time.  Skin: Skin is warm and dry.  Psychiatric: She has a normal mood and affect. Her behavior is normal.    No results found for: WBC, HGB, HCT, PLT, GLUCOSE, CHOL, TRIG, HDL, LDLDIRECT, LDLCALC, ALT, AST, NA, K, CL, CREATININE, BUN, CO2, TSH, PSA, INR, GLUF, HGBA1C, MICROALBUR  US Pelvis Transvanginal Non-ob (tv Only)  Result Date: 08/15/2017 CLINICAL DATA:  Initial evaluation for intermittent right pelvic pain for 1-2 weeks. EXAM: TRANSABDOMINAL AND TRANSVAGINAL ULTRASOUND OF PELVIS TECHNIQUE: Both transabdominal and transvaginal ultrasound examinations of the pelvis were performed. Transabdominal technique was performed for global imaging of the pelvis including uterus, ovaries, adnexal regions, and pelvic cul-de-sac. It was necessary to proceed with endovaginal exam following the transabdominal exam to visualize the uterus and ovaries. COMPARISON:  None FINDINGS: Uterus Measurements: 7.1 x 5.3 x  6.4 cm. No fibroids or other mass visualized. Endometrium Thickness: 16.7 mm.  No focal abnormality visualized. Right ovary Measurements: 3.8 x 2.6 x 2.5 cm. Normal appearance/no adnexal mass. Left ovary Measurements: 3.1 x 1.9 x 3.2 cm. Normal appearance/no adnexal mass. Other findings Moderate volume free fluid within the pelvis, likely physiologic. IMPRESSION: 1. Moderate volume free fluid within the pelvis, likely physiologic. 2. Endometrial stripe measures 16.7 mm without focal abnormality, consistent with secretory phase endometrium. 3. Otherwise normal sonographic appearance of the uterus and ovaries. No adnexal mass. Electronically Signed   By: Rise Mu M.D.   On: 08/15/2017 21:15   US Pelvis (transabdominal Only)  Result Date: 08/15/2017 CLINICAL DATA:  Initial evaluation for intermittent right pelvic pain for 1-2 weeks. EXAM: TRANSABDOMINAL AND TRANSVAGINAL ULTRASOUND  OF PELVIS TECHNIQUE: Both transabdominal and transvaginal ultrasound examinations of the pelvis were performed. Transabdominal technique was performed for global imaging of the pelvis including uterus, ovaries, adnexal regions, and pelvic cul-de-sac. It was necessary to proceed with endovaginal exam following the transabdominal exam to visualize the uterus and ovaries. COMPARISON:  None FINDINGS: Uterus Measurements: 7.1 x 5.3 x 6.4 cm. No fibroids or other mass visualized. Endometrium Thickness: 16.7 mm.  No focal abnormality visualized. Right ovary Measurements: 3.8 x 2.6 x 2.5 cm. Normal appearance/no adnexal mass. Left ovary Measurements: 3.1 x 1.9 x 3.2 cm. Normal appearance/no adnexal mass. Other findings Moderate volume free fluid within the pelvis, likely physiologic. IMPRESSION: 1. Moderate volume free fluid within the pelvis, likely physiologic. 2. Endometrial stripe measures 16.7 mm without focal abnormality, consistent with secretory phase endometrium. 3. Otherwise normal sonographic appearance of the uterus and  ovaries. No adnexal mass. Electronically Signed   By: Rise Mu M.D.   On: 08/15/2017 21:15    Assessment & Plan:   Elenor was seen today for establish care.  Diagnoses and all orders for this visit:  Preventative health care -     CBC -     Comprehensive metabolic panel -     TSH  Need for vaccine for DT (diphtheria-tetanus) -     Td : Tetanus/diphtheria >7yo Preservative  free   I am having Angelica Yu maintain her multivitamin with minerals, Vitamin D, and topiramate.  No orders of the defined types were placed in this encounter.   Follow-up: No follow-ups on file.  Alysia Penna, NP

## 2018-01-09 ENCOUNTER — Ambulatory Visit
Payer: No Typology Code available for payment source | Attending: Physical Medicine & Rehabilitation | Admitting: Rehabilitative and Restorative Service Providers"

## 2018-01-09 DIAGNOSIS — H8111 Benign paroxysmal vertigo, right ear: Secondary | ICD-10-CM | POA: Insufficient documentation

## 2018-01-09 DIAGNOSIS — R42 Dizziness and giddiness: Secondary | ICD-10-CM | POA: Insufficient documentation

## 2018-01-09 NOTE — Therapy (Signed)
[facility] [address] [place], [place], [address] Phone: [phone]   Fax:  [phone]  Physical Therapy Evaluation  Patient Details  Name: Angelica Yu MRN: [id number] Date of Birth: 1996/03/27 Referring Provider: Faith Rogue, MD   Encounter Date: 01/09/2018  PT End of Session - 01/09/18 1346    Visit Number  1    Number of Visits  8    Date for PT Re-Evaluation  02/23/18    Authorization Type  worker's comp:  Fax to [employer], claim specalist at [employer] [phone]; Phone number [phone]    PT Start Time  1150    PT Stop Time  1238    PT Time Calculation (min)  48 min    Activity Tolerance  Treatment limited secondary to medical complications (Comment) PT evaluation provokes 9/10 dizziness with nausea    Behavior During Therapy  [facility] for tasks assessed/performed       Past Medical History:  Diagnosis Date  . Concussion 10/23/2017  . Sports hernia     Past Surgical History:  Procedure Laterality Date  . adnoids    . TONSILLECTOMY      There were no vitals filed for this visit.   Subjective Assessment - 01/09/18 1152    Subjective  The patient is s/p concussion 10/23/17 from hitting her head on a cabinet edge when standing.  She has HA, dizziness, light sensitivity, sound sensitivity, sleep difficulties, nausea (no vomitting).   She was evaluated at [facility].  Dizziness got worse at approximately 4 weeks after initial injury.  Current status for work is : 4 hours/day (she regressed last week).  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    Head fog and spinning sensations are worse after screen time.  A good day is 30 minutes of screen time, other days can tolerate 5 minutes.    She reports sensation of orthostatic hypotension (feeling like she could black out when she gets up after sitting for longer periods), and notes dizziness with bed moiblity.  She notes a sensation of  popping in her right ear, and when lying down (feels it when she swallows), she also notes a pulsatile sensation in her right ear with some echoing sensations.  She is avoiding loud environments.     Patient Stated Goals  "Get back to a normal lifestyle".  She notes she has not been able to get back to her activity level due to prior spors hernia and the nconcussion.     Currently in Pain?  Yes    Pain Score  5  4-5/10, noting this is a good day.  She feels she is improving with Topamax.    Pain Location  Head temporal area    Pain Orientation  Right    Pain Descriptors / Indicators  Headache    Pain Type  Acute pain    Pain Onset  More than a month ago    Pain Frequency  Intermittent    Aggravating Factors   lights, sounds    Pain Relieving Factors  topamax seems to be helping         [facility] PT Assessment - 01/09/18 1203      Assessment   Medical Diagnosis  Post concussive disorder, BPPV    Referring Provider  Faith Rogue, MD    Onset Date/Surgical Date  10/23/17    Hand Dominance  Right    Prior Therapy  none  Precautions   Precautions  Other (comment)    Precaution Comments  post concussive      Restrictions   Weight Bearing Restrictions  No      Balance Screen   Has the patient fallen in the past 6 months  No    Has the patient had a decrease in activity level because of a fear of falling?   No    Is the patient reluctant to leave their home because of a fear of falling?   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  Dizziness is related to exertion noting that she wakes up without dizziness and it worsens t/o the day.  Baseline level of dizziness:  4/10.      Occulomotor Exam   Occulomotor Alignment  Normal    Spontaneous  Absent    Gaze-induced  Absent    Head shaking Horizontal  Absent with frenzels donned, 9/10 symptoms no eval    Smooth Pursuits  Intact The patient notes a twitching of muscles-feels uncomfortable    Saccades  Intact    Comment  convergence=WNLs      Vestibulo-Occular Reflex   VOR 1 Head Only (x 1 viewing)  Slow VOR x 5 reps provokes a sensation of body spinning,   8-9/10 dizziness after slow VOR with some nausea noted.    Comment  Head impulse testing= positive for a saccade during motion to the left and was able to keep fixation to the right.      Positional Testing   Dix-Hallpike  Dix-Hallpike Right;Dix-Hallpike Left    Sidelying Test  --    Horizontal Canal Testing  Horizontal Canal Left;Horizontal Canal Right added frenzels, did not view in room light      Dix-Hallpike Right   Dix-Hallpike Right Duration  "sensation of blood rushing to my head"    Dix-Hallpike Right Symptoms  No nystagmus      Dix-Hallpike Left   Dix-Hallpike Left Duration  0    Dix-Hallpike Left Symptoms  No nystagmus      Horizontal Canal Right   Horizontal Canal Right Duration  None, reports "lights are bothering me"    Horizontal Canal Right Symptoms  Normal      Horizontal Canal Left   Horizontal Canal Left Duration  INitially noted 5 beats of apogeotropic nystagmus with L roll when moving eyes into 30 degrees of R gaze (alexander's law), however repeated with frenzel lenses and no nystagmus viewed with fixation blocked     Horizontal Canal Left Symptoms  Ageotrophic 5 beats, with dizziness x seconds  Objective measurements completed on examination: See above findings.       Vestibular Treatment/Exercise - 01/09/18 1229      Vestibular Treatment/Exercise   Vestibular Treatment Provided  Habituation;Gaze    Habituation Exercises  Horizontal Roll    Gaze Exercises  X1 Viewing Horizontal      Horizontal Roll   Number of Reps   3    Symptom Description   Showed on home exercise handout, did not perform due to symptoms currently 8-9/10      X1 Viewing Horizontal   Foot Position  seated    Reps  5    Comments  increases symptoms to 9/10 at end of evaluation.  Discussed instructions for home and recommended beginning with 5 reps and will increase as able to tolerate.            PT Education - 01/09/18 1336    Education provided  Yes    Education Details  HEP: habituation for rolling, gaze x 5 reps x 1 viewing, and special instructions to limit symptom provocation    Person(s) Educated  Patient    Methods  Explanation;Handout;Demonstration    Comprehension  Verbalized understanding *PT demonstrated due to provocation of symptoms       PT Short Term Goals - 01/09/18 1354      PT SHORT TERM GOAL #1   Title  The patient will return demo HEP for gaze adaptation, habituation, high level balance.    Time  4    Period  Weeks    Target Date  02/08/18      PT SHORT TERM GOAL #2   Title  The patient will improve gaze x 1 adaptation from tolerating 5 reps up to 30 seconds with change in dizziness < or equal to 2/10.    Baseline  Dizziness increases from 4/10 up to 8-9/10 with slow gaze x 1.    Time  4    Period  Weeks    Target Date  02/08/18      PT SHORT TERM GOAL #3   Title  The patient will be assessed on buffalo concussion treadmill test and LTG to follow.    Time  4    Period  Weeks    Target Date  02/08/18      PT SHORT TERM GOAL #4   Title  The patient will tolerate  rolling supine<>bilateral sidelying with no increase in subjective reports of dizziness.    Time  4    Period  Weeks    Target Date  02/08/18        PT Long Term Goals - 01/09/18 1416      PT LONG TERM GOAL #1   Title  The patient will improve functional status score from 67% to > or equal to 78% to demo improving functional mobility.    Time  6    Period  Weeks    Target Date  02/23/18      PT LONG TERM GOAL #2   Title  The patient will tolerate gaze x 1 x 1 minute without subjective reports of dizziness to demo improving tolerance to head motion.    Time  6    Period  Weeks    Target Date  02/23/18      PT LONG TERM GOAL #3   Title  The patient will be indep with HEP progression for gaze, motion sensitivity, exercise tolerance.    Time  6    Period  Weeks    Target Date  02/23/18      PT LONG TERM GOAL #4   Title  The patient will return to work activities x 6-8 hours/day to demo improved tolerance to activity.    Time  6    Period  Weeks    Target Date  02/23/18      PT LONG TERM GOAL #5   Title  The patient will returnt to modified gym/workout routine for community fitness.    Time  6    Period  Weeks    Target Date  02/23/18             Plan - 01/09/18 1548    Clinical Impression Statement  The patient is a 22 year old female s/p concussion 10/23/2017 presenting with imapirments of motion sensitivity, decreased VOR, possible BPPV (further assessment to follow for left horizontal roll- noted symptoms on first rep only), and activity induced dizziness.   She is also experiencing other post concussive symptoms of difficulty sleeping, fatigue, headache.  Patient reports audiologic symptoms of R ear fullness and muffled hearing.  PT to address identified impairments.  HA being managed medically.  If audiologic symptoms persist, she may benefit from further ENT workup.      History and Personal Factors relevant to plan of care:  post concussive, limited work tolerance     Clinical Presentation  Stable    Clinical Decision Making  Moderate    Rehab Potential  Good    PT Frequency  2x / week    PT Duration  3 weeks then 1x/week x 2 weeks    PT Treatment/Interventions  ADLs/Self Care Home Management;Canalith Repostioning;Gait training;Patient/family education;Therapeutic exercise;Neuromuscular re-education;Balance training;Vestibular    PT Next Visit Plan  [place] concussion treadmill test, check HEP (discuss progression), recheck for BPPV (L horizontal roll- anticipate possible R cupulolithiasis horizontal canal), habituation to tolerance.    Consulted and Agree with Plan of Care  Patient       Patient will benefit from skilled therapeutic intervention in order to improve the following deficits and impairments:  Decreased activity tolerance, Decreased balance, Dizziness, Impaired vision/preception  Visit Diagnosis: Dizziness and giddiness  BPPV (benign paroxysmal positional vertigo), right     Problem List Patient Active Problem List   Diagnosis Date Noted  . Post concussion syndrome 01/01/2018  . BPPV (benign paroxysmal positional vertigo), bilateral 01/01/2018  . Post-traumatic headache 01/01/2018  . Sports hernia 09/05/2017  . Pelvic pain in female 08/14/2017    Mei Suits, PT 01/09/2018, 3:53 PM  [facility] [address] [place], [place], [address] Phone: [phone]   Fax:  [phone]  Name: Angelica Yu MRN: [id number] Date of Birth: September 18, 1996

## 2018-01-09 NOTE — Patient Instructions (Signed)
Special Instructions: Exercises may bring on mild to moderate symptoms (4-5/10) of dizziness  nause that resolve within 10-15 minutes of completing exercises. If symptoms are lasting longer than 15 minutes, modify your exercises by:  >decreasing the # of times you complete each activity >ensuring your symptoms return to baseline before moving onto the next exercise >dividing up exercises so you do not do them all in one session, but multiple short sessions throughout the day >doing them once a day until symptoms improve    Gaze Stabilization: Sitting    Keeping eyes on target on wall 3 feet away, and move head side to side for x 5 times. Do __2-3__ sessions per day.  Copyright  VHI. All rights reserved.   Gaze Stabilization: Tip Card  1.Target must remain in focus, not blurry, and appear stationary while head is in motion. 2.Perform exercises with small head movements (45 to either side of midline). 3.Increase speed of head motion so long as target is in focus. 4.If you wear eyeglasses, be sure you can see target through lens (therapist will give specific instructions for bifocal / progressive lenses). 5.These exercises may provoke dizziness or nausea. Work through these symptoms. If too dizzy, slow head movement slightly. Rest between each exercise. 6.Exercises demand concentration; avoid distractions.  Copyright  VHI. All rights reserved.    Habituation - Tip Card  1.The goal of habituation training is to assist in decreasing symptoms of vertigo, dizziness, or nausea provoked by specific head and body motions. 2.These exercises may initially increase symptoms; however, be persistent and work through symptoms. With repetition and time, the exercises will assist in reducing or eliminating symptoms. 3.Exercises should be stopped and discussed with the therapist if you experience any of the following: - Sudden change or fluctuation in hearing - New onset of ringing in the ears,  or increase in current intensity - Any fluid discharge from the ear - Severe pain in neck or back - Extreme nausea  Copyright  VHI. All rights reserved.   Habituation - Rolling   With pillow under head, start on back. Roll to your right side.  Hold until dizziness stops, plus 20 seconds and then roll to the left side.  Hold until dizziness stops, plus 20 seconds.  Repeat sequence 3-5 times per session. Do 2 sessions per day.  Copyright  VHI. All rights reserved.

## 2018-01-10 ENCOUNTER — Ambulatory Visit
Payer: No Typology Code available for payment source | Attending: Nurse Practitioner | Admitting: Rehabilitative and Restorative Service Providers"

## 2018-01-10 ENCOUNTER — Ambulatory Visit: Payer: BLUE CROSS/BLUE SHIELD | Admitting: Rehabilitative and Restorative Service Providers"

## 2018-01-10 DIAGNOSIS — H8111 Benign paroxysmal vertigo, right ear: Secondary | ICD-10-CM | POA: Insufficient documentation

## 2018-01-10 DIAGNOSIS — R42 Dizziness and giddiness: Secondary | ICD-10-CM | POA: Insufficient documentation

## 2018-01-10 NOTE — Therapy (Signed)
[facility] [address] [place], [place], [address] Phone: [phone]   Fax:  [phone]  Physical Therapy Treatment  Patient Details  Name: Angelica Yu MRN: [id number] Date of Birth: 10/06/1995 Referring Provider: Faith RogueZachary Swartz, MD   Encounter Date: 01/10/2018  PT End of Session - 01/10/18 1415    Visit Number  2    Number of Visits  8    Date for PT Re-Evaluation  02/23/18    Authorization Type  worker's comp:  Fax to [employer], claim specalist at [employer] [phone]; Phone number [phone]    PT Start Time  1415    PT Stop Time  1455    PT Time Calculation (min)  40 min    Behavior During Therapy  [facility] for tasks assessed/performed       Past Medical History:  Diagnosis Date  . Concussion 10/23/2017  . Sports hernia     Past Surgical History:  Procedure Laterality Date  . adnoids    . TONSILLECTOMY      There were no vitals filed for this visit.  Subjective Assessment - 01/10/18 1416    Subjective  The patient notes that symptoms lasted x an hour after yesterday's evaluation.  She went home and slept x 1.5 hours and woke with HA, but no dizziness and nausea.  She woke today feeling okay, and worked for 3.5 hours today.  She notes ear popping when on her left side and swallows.  She has also noticed that bending forward provoked a sensation of dizzy and "wobbly" sensation.           [facility] PT Assessment - 01/10/18 1436      Ambulation/Gait   Gait Comments  *Buffalo Concussion Treadmill Test- see in note.             BASELINE: 4/10 headache "just pressure in the back of my head (points to behind right ear)". 5/10 dizziness described as counterclockwise movement like she is constantly swaying Min HR RPE Overall condition (Likert Scale) Symptoms/Observations  REST      Begin @3 .6 mph for 5'5"; 3.2 mph for <5'5"; begin at 0 degrees  0 125 9 5/10 HA and dizziness/ swaying  1 128 12  5/10 HA and dizziness/swaying  2 127 12 5/10 HA and dizziness/swaying  3 130 13 6/10 HA/dizziness + fogginess developing  4 134 14 6/10 Same  5 141 15 6/10 Notes same with addition of light sensitivity  6    same  7 141 15 6/10 same  8 141 17 8/10 Fogginess and lightheadedness have jumped, headache has not increased  9 149 18 8/10   10      11      12      13      14      15       With 15 degree incline reached, begin increasing speed 0.4 mph/min  16      17      18      19      20       Post-Exercise (reduce speed to 2.5 mph x 2 minute cool down)  1      2   7/10           [facility] Adult PT Treatment/Exercise - 01/10/18 1436      Ambulation/Gait   Ambulation/Gait  Yes    Ambulation/Gait Assistance  7: Independent    Ambulation Distance (Feet)  --  9 minutes until 3 point change on likert scale      Self-Care   Self-Care  Other Self-Care Comments    Other Self-Care Comments   Discussed using information from [facility] to begin light aerobic activity recommending short walks, but can do multiple times/day as tolerated.  Also discussed symptom monitoring to know when to progress.      Vestibular Treatment/Exercise - 01/10/18 1453      Vestibular Treatment/Exercise   Vestibular Treatment Provided  Habituation    Habituation Exercises  Horizontal Roll      Horizontal Roll   Number of Reps   4    Symptom Description   Patient notes some cyclical spinning with left roll that lasts x seconds.  No nystagmus are viewed in room light.  After 4th rep, she notes more strained feeling and fogginess.            PT Education - 01/10/18 2038    Education provided  Yes    Education Details  HEP walking    Person(s) Educated  Patient    Methods  Explanation    Comprehension  Verbalized understanding       PT Short Term Goals - 01/09/18 1354      PT SHORT TERM GOAL #1   Title  The patient will return demo HEP for gaze adaptation, habituation, high level balance.    Time  4     Period  Weeks    Target Date  02/08/18      PT SHORT TERM GOAL #2   Title  The patient will improve gaze x 1 adaptation from tolerating 5 reps up to 30 seconds with change in dizziness < or equal to 2/10.    Baseline  Dizziness increases from 4/10 up to 8-9/10 with slow gaze x 1.    Time  4    Period  Weeks    Target Date  02/08/18      PT SHORT TERM GOAL #3   Title  The patient will be assessed on buffalo concussion treadmill test and LTG to follow.    Time  4    Period  Weeks    Target Date  02/08/18      PT SHORT TERM GOAL #4   Title  The patient will tolerate rolling supine<>bilateral sidelying with no increase in subjective reports of dizziness.    Time  4    Period  Weeks    Target Date  02/08/18        PT Long Term Goals - 01/09/18 1416      PT LONG TERM GOAL #1   Title  The patient will improve functional status score from 67% to > or equal to 78% to demo improving functional mobility.    Time  6    Period  Weeks    Target Date  02/23/18      PT LONG TERM GOAL #2   Title  The patient will tolerate gaze x 1 x 1 minute without subjective reports of dizziness to demo improving tolerance to head motion.    Time  6    Period  Weeks    Target Date  02/23/18      PT LONG TERM GOAL #3   Title  The patient will be indep with HEP progression for gaze, motion sensitivity, exercise tolerance.    Time  6    Period  Weeks    Target Date  02/23/18  PT LONG TERM GOAL #4   Title  The patient will return to work activities x 6-8 hours/day to demo improved tolerance to activity.    Time  6    Period  Weeks    Target Date  02/23/18      PT LONG TERM GOAL #5   Title  The patient will returnt to modified gym/workout routine for community fitness.    Time  6    Period  Weeks    Target Date  02/23/18            Plan - 01/10/18 2103    Clinical Impression Statement  The patient has exertional dizziness per tolerance of activity on buffalo concussion treadmill  testing.  PT initiated habituation, and home walking program with recommendation to continue VOR for HEP.  No further positional vertigo noted.  PT emphasizing frequent bouts of walking and light aerobic activity.    PT Treatment/Interventions  ADLs/Self Care Home Management;Canalith Repostioning;Gait training;Patient/family education;Therapeutic exercise;Neuromuscular re-education;Balance training;Vestibular    PT Next Visit Plan  HEP, habituation, gaze, balance activities    Consulted and Agree with Plan of Care  Patient       Patient will benefit from skilled therapeutic intervention in order to improve the following deficits and impairments:  Decreased activity tolerance, Decreased balance, Dizziness, Impaired vision/preception  Visit Diagnosis: Dizziness and giddiness  BPPV (benign paroxysmal positional vertigo), right     Problem List Patient Active Problem List   Diagnosis Date Noted  . Post concussion syndrome 01/01/2018  . BPPV (benign paroxysmal positional vertigo), bilateral 01/01/2018  . Post-traumatic headache 01/01/2018  . Sports hernia 09/05/2017  . Pelvic pain in female 08/14/2017    Euphemia Lingerfelt, PT 01/10/2018, 9:06 PM  [facility] [address] [place], [place], [address] Phone: [phone]   Fax:  [phone]  Name: Angelica Yu MRN: [id number] Date of Birth: 04-21-1996

## 2018-01-10 NOTE — Patient Instructions (Signed)
WALKING  Walking is a great form of exercise to increase your strength, endurance and overall fitness.  A walking program can help you start slowly and gradually build endurance as you go.  Everyone's ability is different, so each person's starting point will be different.  You do not have to follow them exactly.  The are just samples. You should simply find out what's right for you and stick to that program.   In the beginning, you'll start off walking 2-3 times a day for short distances.  As you get stronger, you'll be walking further at just 1-2 times per day.  A. You Can Walk For A Certain Length Of Time Each Day    Walk 10 minutes at light to moderate activity level, 2-3 times/day.  We will stay at this level until you have 3 days without increased dizziness, fogginess, or increased headache.  Continue other home exercises.

## 2018-01-11 ENCOUNTER — Encounter: Payer: Self-pay | Admitting: Rehabilitative and Restorative Service Providers"

## 2018-01-14 ENCOUNTER — Ambulatory Visit: Payer: No Typology Code available for payment source | Admitting: Rehabilitative and Restorative Service Providers"

## 2018-01-14 ENCOUNTER — Encounter: Payer: Self-pay | Admitting: Rehabilitative and Restorative Service Providers"

## 2018-01-14 ENCOUNTER — Encounter: Payer: Self-pay | Admitting: Physical Medicine & Rehabilitation

## 2018-01-14 DIAGNOSIS — R42 Dizziness and giddiness: Secondary | ICD-10-CM

## 2018-01-14 DIAGNOSIS — H8111 Benign paroxysmal vertigo, right ear: Secondary | ICD-10-CM

## 2018-01-14 NOTE — Patient Instructions (Addendum)
Special Instructions: Exercises may bring on mild to moderate symptoms (4-5/10) of dizziness  nause that resolve within 10-15 minutes of completing exercises. If symptoms are lasting longer than 15 minutes, modify your exercises by:  >decreasing the # of times you complete each activity >ensuring your symptoms return to baseline before moving onto the next exercise >dividing up exercises so you do not do them all in one session, but multiple short sessions throughout the day >doing them once a day until symptoms improve  Gaze Stabilization - Tip Card  1.Target must remain in focus, not blurry, and appear stationary while head is in motion. 2.Perform exercises with small head movements (45 to either side of midline). 3.Increase speed of head motion so long as target is in focus. 4.If you wear eyeglasses, be sure you can see target through lens (therapist will give specific instructions for bifocal / progressive lenses). 5.These exercises may provoke dizziness or nausea. Work through these symptoms. If too dizzy, slow head movement slightly. Rest between each exercise. 6.Exercises demand concentration; avoid distractions. 7.For safety, perform standing exercises close to a counter, wall, corner, or next to someone.  Copyright  VHI. All rights reserved.   Gaze Stabilization - Standing Feet Apart   Feet shoulder width apart, keeping eyes on target on wall 3 feet away, tilt head down slightly and move head side to side for 5 repetitions. Repeat while moving head up and down for 5-10 (depending on tolerance). *Work up to tolerating 30 seconds, as able. Do 2-3 sessions per day.   Copyright  VHI. All rights reserved.     Habituation - Tip Card  1.The goal of habituation training is to assist in decreasing symptoms of vertigo, dizziness, or nausea provoked by specific head and body motions. 2.These exercises may initially increase symptoms; however, be persistent and work through  symptoms. With repetition and time, the exercises will assist in reducing or eliminating symptoms. 3.Exercises should be stopped and discussed with the therapist if you experience any of the following: - Sudden change or fluctuation in hearing - New onset of ringing in the ears, or increase in current intensity - Any fluid discharge from the ear - Severe pain in neck or back - Extreme nausea  Copyright  VHI. All rights reserved.   Habituation - Rolling   With pillow under head, start on back. Roll to your right side.  Hold until dizziness stops, plus 20 seconds and then roll to the left side.  Hold until dizziness stops, plus 20 seconds.  Repeat sequence 3-5 times per session. Do 2 sessions per day.  Copyright  VHI. All rights reserved.    Feet Apart (Compliant Surface) Varied Arm Positions - Eyes Closed    Stand on compliant surface: ___pillow__ with feet shoulder width apart and arms at your side.  Close eyes and visualize upright position. Hold__30__ seconds. Repeat _3___ times per session. Do _1-2___ sessions per day.  Copyright  VHI. All rights reserved.   Yoga With Adrienne (breathing for anxiety).    Try to monitor HR to see if we can keep it at or below 102 bpm (this is based on performance on treadmill test for exertion- symptoms remained baseline when you were at 128 bpm).

## 2018-01-14 NOTE — Therapy (Signed)
[facility] [address] [place], [place], [address] Phone: [phone]   Fax:  [phone]  Physical Therapy Treatment  Patient Details  Name: Angelica Yu MRN: [id number] Date of Birth: 1996/01/28 Referring Provider: Faith Rogue, MD   Encounter Date: 01/14/2018  PT End of Session - 01/14/18 0938    Visit Number  3    Number of Visits  8    Date for PT Re-Evaluation  02/23/18    Authorization Type  worker's comp:  Fax to [employer], claim specalist at [employer] [id number]; Phone number [phone]    PT Start Time  0932    PT Stop Time  1015    PT Time Calculation (min)  43 min    Activity Tolerance  Patient tolerated treatment well    Behavior During Therapy  [facility] for tasks assessed/performed       Past Medical History:  Diagnosis Date  . Concussion 10/23/2017  . Sports hernia     Past Surgical History:  Procedure Laterality Date  . adnoids    . TONSILLECTOMY      There were no vitals filed for this visit.  Subjective Assessment - 01/14/18 0934    Subjective  The patient reports she did the VOR x 1 viewing on Saturday and notes "it put me out for awhile".  She walked on Saturday and Sunday.  She notes an increase in 1/10 with walking and then noted stairs aggravated symptoms to 7/20.    The rolling exercise increases symptoms during the exercise, but does not remain for minutes afterwards.        Patient Stated Goals  "Get back to a normal lifestyle".  She notes she has not been able to get back to her activity level due to prior spors hernia and the nconcussion.     Currently in Pain?  Yes    Pain Score  4     Pain Location  Head    Pain Orientation  Right    Pain Descriptors / Indicators  Headache    Pain Onset  More than a month ago    Pain Frequency  Intermittent    Aggravating Factors   "the drive gets me"    Pain Relieving Factors  rest, sleep                       [facility]  Adult PT Treatment/Exercise - 01/14/18 1630      Neuro Re-ed    Neuro Re-ed Details   Corner balance standing exercises with eyes open, eyes closed x 30 seconds beginning with feet wide, moving to more narrow.        Vestibular Treatment/Exercise - 01/14/18 0940      Vestibular Treatment/Exercise   Vestibular Treatment Provided  Gaze;Habituation    Habituation Exercises  Seated Vertical Head Turns    Gaze Exercises  X1 Viewing Horizontal;X1 Viewing Vertical      Horizontal Roll   Symptom Description   Continuing for HEP      Seated Vertical Head Turns   Number of Reps   10    Symptom Description   Ball toss x 10 reps seated with minimal head motion and no increase in dizziness.  then moved to walking x 40 feet.       Standing Vertical Head Turns   Symptom Description   With walking tasks began working on ball toss for vertical plane motion.  This provokes dizziness that decreases  when she stops.  Headache with minimal change.      X1 Viewing Horizontal   Foot Position  standing    Reps  5    Comments  The patient gets increase in HA  and dizziness to 4/10 after horizontal gaze.      X1 Viewing Vertical   Foot Position  standing    Reps  5    Comments  The patient tolerates vertical gaze x 1 more than horizontal.            PT Education - 01/14/18 0956    Education provided  Yes    Education Details  HEP: modified gaze to standing, added vertical VOR x 1 viewing    Person(s) Educated  Patient    Methods  Explanation;Demonstration;Handout    Comprehension  Verbalized understanding;Returned demonstration       PT Short Term Goals - 01/09/18 1354      PT SHORT TERM GOAL #1   Title  The patient will return demo HEP for gaze adaptation, habituation, high level balance.    Time  4    Period  Weeks    Target Date  02/08/18      PT SHORT TERM GOAL #2   Title  The patient will improve gaze x 1 adaptation from tolerating 5 reps up to 30 seconds with change in dizziness  < or equal to 2/10.    Baseline  Dizziness increases from 4/10 up to 8-9/10 with slow gaze x 1.    Time  4    Period  Weeks    Target Date  02/08/18      PT SHORT TERM GOAL #3   Title  The patient will be assessed on [place] concussion treadmill test and LTG to follow.    Time  4    Period  Weeks    Target Date  02/08/18      PT SHORT TERM GOAL #4   Title  The patient will tolerate rolling supine<>bilateral sidelying with no increase in subjective reports of dizziness.    Time  4    Period  Weeks    Target Date  02/08/18        PT Long Term Goals - 01/09/18 1416      PT LONG TERM GOAL #1   Title  The patient will improve functional status score from 67% to > or equal to 78% to demo improving functional mobility.    Time  6    Period  Weeks    Target Date  02/23/18      PT LONG TERM GOAL #2   Title  The patient will tolerate gaze x 1 x 1 minute without subjective reports of dizziness to demo improving tolerance to head motion.    Time  6    Period  Weeks    Target Date  02/23/18      PT LONG TERM GOAL #3   Title  The patient will be indep with HEP progression for gaze, motion sensitivity, exercise tolerance.    Time  6    Period  Weeks    Target Date  02/23/18      PT LONG TERM GOAL #4   Title  The patient will return to work activities x 6-8 hours/day to demo improved tolerance to activity.    Time  6    Period  Weeks    Target Date  02/23/18      PT LONG TERM GOAL #  5   Title  The patient will returnt to modified gym/workout routine for community fitness.    Time  6    Period  Weeks    Target Date  02/23/18            Plan - 01/14/18 1631    Clinical Impression Statement  PT focused on alternating tasks from gaze, to balance including some activities without head motion in order to allow time for symptoms to settle to baseline.  The patient had a small change in headache intensity.  She continues with daily exercise and plans to call physician for further  recommendations.    PT Treatment/Interventions  ADLs/Self Care Home Management;Canalith Repostioning;Gait training;Patient/family education;Therapeutic exercise;Neuromuscular re-education;Balance training;Vestibular    PT Next Visit Plan  HEP, habituation, gaze, balance activities    Consulted and Agree with Plan of Care  Patient       Patient will benefit from skilled therapeutic intervention in order to improve the following deficits and impairments:  Decreased activity tolerance, Decreased balance, Dizziness, Impaired vision/preception  Visit Diagnosis: Dizziness and giddiness  BPPV (benign paroxysmal positional vertigo), right     Problem List Patient Active Problem List   Diagnosis Date Noted  . Post concussion syndrome 01/01/2018  . BPPV (benign paroxysmal positional vertigo), bilateral 01/01/2018  . Post-traumatic headache 01/01/2018  . Sports hernia 09/05/2017  . Pelvic pain in female 08/14/2017    Shaney Deckman, PT 01/14/2018, 4:33 PM  [facility] [address] [place], [place], [address] Phone: [phone]   Fax:  [phone]  Name: Angelica Yu MRN: [id number] Date of Birth: [birth year]

## 2018-01-16 ENCOUNTER — Encounter: Payer: Worker's Compensation | Admitting: Rehabilitative and Restorative Service Providers"

## 2018-01-16 ENCOUNTER — Ambulatory Visit: Payer: No Typology Code available for payment source | Admitting: Rehabilitative and Restorative Service Providers"

## 2018-01-16 ENCOUNTER — Encounter: Payer: Self-pay | Admitting: Rehabilitative and Restorative Service Providers"

## 2018-01-16 DIAGNOSIS — H8111 Benign paroxysmal vertigo, right ear: Secondary | ICD-10-CM

## 2018-01-16 DIAGNOSIS — R42 Dizziness and giddiness: Secondary | ICD-10-CM | POA: Diagnosis not present

## 2018-01-16 NOTE — Patient Instructions (Signed)
Thoracic Self-Mobilization (Supine)    With rolled towel placed lengthwise at lower ribs level, lie back on towel with arms outstretched. Hold _2 minutes. Relax. Repeat __1-2__ times per day as needed for postural stretching.  http://orth.exer.us/1001   Copyright  VHI. All rights reserved.   Healthy Back - Shoulder Roll    Stand straight with arms relaxed at sides. Roll shoulders backward continuously. Do __10__ times.  Can repeat throughout the day, as tolerated.  Copyright  VHI. All rights reserved.   Extensors, Supine    Lie supine, head on small, rolled towel or one pillow.  Gently tuck chin and bring toward chest. Hold __5_ seconds. Repeat __10_ times per session. Do _2__ sessions per day.  Copyright  VHI. All rights reserved.   Upper Limb Neural Tension I, Standing    Stand, one hand over top of head, other hand against low back. Turn head down toward pulling side. Gently increase stretch by pulling on head and depressing opposite (stretch-side) shoulder blade. Hold _20__ seconds. Repeat _3__ times per session. Do _1-2__ sessions per day.  Copyright  VHI. All rights reserved.

## 2018-01-16 NOTE — Therapy (Signed)
[facility] [address] [place], [place], [address] Phone: [phone]   Fax:  [phone]  Physical Therapy Treatment  Patient Details  Name: Angelica Yu MRN: [id number] Date of Birth: [birth year] Referring Provider: Faith Rogue, MD   Encounter Date: 01/16/2018  PT End of Session - 01/16/18 0852    Visit Number  4    Number of Visits  8    Date for PT Re-Evaluation  02/23/18    Authorization - Visit Number  3    Authorization - Number of Visits  6    PT Start Time  0848    PT Stop Time  0928    PT Time Calculation (min)  40 min    Activity Tolerance  Patient tolerated treatment well    Behavior During Therapy  [facility] for tasks assessed/performed       Past Medical History:  Diagnosis Date  . Concussion 10/23/2017  . Sports hernia     Past Surgical History:  Procedure Laterality Date  . adnoids    . TONSILLECTOMY      There were no vitals filed for this visit.  Subjective Assessment - 01/16/18 0848    Subjective  The patient notes she can tolerate vertical VOR + balance home exercises without significant increase in symptoms.  She walked last night and worked 4 hours yesterday.   She thinks she may walk in the morning instead of at night.   The patinet called physician to inquire about med changes for headache management.    Pre walk HR=96, during walk 108, rested it went down to 90 and then it went up to 132 with stairs into the building.     Patient Stated Goals  "Get back to a normal lifestyle".  She notes she has not been able to get back to her activity level due to prior spors hernia and the nconcussion.     Currently in Pain?  Yes    Pain Score  4     Pain Location  Head    Pain Descriptors / Indicators  Headache    Pain Onset  More than a month ago    Pain Frequency  Intermittent    Aggravating Factors   Woke with a headache today    Pain Relieving Factors  rest, sleep              Vestibular Assessment - 01/16/18 0852      Vestibular Assessment   General Observation  2/10 baseline dizziness; not a prevalent symptom today, the headache is more constant today.               [facility] Adult PT Treatment/Exercise - 01/16/18 0853      Neuro Re-ed    Neuro Re-ed Details   Corner balance exercises on foam with vertical head turns + feet apart, horizontal turns x 5 reps + feet apart, eyes closed +up to midline head motion and eyes closed down to midline x 5 reps each.  STanding feet together with eyes closed x 30 seconds with increased sway.  Dizziness up to 4/10 (from baseline 2/10),slight increase in HA.      Exercises   Exercises  Other Exercises;Neck    Other Exercises   NECK:  Patient with significant muscle guarding and tightness noted when performing head motion with compliant surfaces.  PT performed passive stretching and overpressure for cervical stretching.       Neck Exercises: Seated   Lateral Flexion  Right;Left    Lateral Flexion Limitations  with contralateral IR of shoulder to depress shoulder for greater stretch holding 30 seconds each side.     Shoulder Rolls  Backwards;10 reps      Neck Exercises: Supine   Neck Retraction  10 reps with towel roll    Neck Retraction Limitations  cues for technique    Other Supine Exercise  towel roll stretch for self mobilization of thoracic spine.      Manual Therapy   Manual Therapy  Joint mobilization;Soft tissue mobilization;Manual Traction    Manual therapy comments  to reduce cervical guarding    Joint Mobilization  lateral glides mid and upper c-spine grade I-II for relaxation    Soft tissue mobilization  upper trap and scalene soft tissue mob    Manual Traction  gentle manual traction supine      Vestibular Treatment/Exercise - 01/16/18 0001      Vestibular Treatment/Exercise   Vestibular Treatment Provided  Habituation;Gaze    Habituation Exercises  Standing Horizontal Head  Turns;Standing Vertical Head Turns;Seated Horizontal Head Turns      Seated Horizontal Head Turns   Number of Reps   5    Symptom Description   With eyes closed seated at slow pace.  Patient's dizziness increases from 4/10 up to 6/10.  Rested and then repeated x 3 reps with eyes open with dizziness       Standing Horizontal Head Turns   Number of Reps   5    Symptom Description   On compliant surfaces with eyes open      Standing Vertical Head Turns   Number of Reps   5    Symptom Description   On compliant surfaces with eyes open *see NMR            PT Education - 01/16/18 0924    Education provided  Yes    Education Details  HEP:  neck/ postural activities including chin tuck, thoracic self mobilization, shoulder circles, trapezius stretching    Person(s) Educated  Patient    Methods  Explanation;Demonstration;Handout    Comprehension  Returned demonstration;Verbalized understanding       PT Short Term Goals - 01/09/18 1354      PT SHORT TERM GOAL #1   Title  The patient will return demo HEP for gaze adaptation, habituation, high level balance.    Time  4    Period  Weeks    Target Date  02/08/18      PT SHORT TERM GOAL #2   Title  The patient will improve gaze x 1 adaptation from tolerating 5 reps up to 30 seconds with change in dizziness < or equal to 2/10.    Baseline  Dizziness increases from 4/10 up to 8-9/10 with slow gaze x 1.    Time  4    Period  Weeks    Target Date  02/08/18      PT SHORT TERM GOAL #3   Title  The patient will be assessed on [place] concussion treadmill test and LTG to follow.    Time  4    Period  Weeks    Target Date  02/08/18      PT SHORT TERM GOAL #4   Title  The patient will tolerate rolling supine<>bilateral sidelying with no increase in subjective reports of dizziness.    Time  4    Period  Weeks    Target Date  02/08/18  PT Long Term Goals - 01/09/18 1416      PT LONG TERM GOAL #1   Title  The patient will  improve functional status score from 67% to > or equal to 78% to demo improving functional mobility.    Time  6    Period  Weeks    Target Date  02/23/18      PT LONG TERM GOAL #2   Title  The patient will tolerate gaze x 1 x 1 minute without subjective reports of dizziness to demo improving tolerance to head motion.    Time  6    Period  Weeks    Target Date  02/23/18      PT LONG TERM GOAL #3   Title  The patient will be indep with HEP progression for gaze, motion sensitivity, exercise tolerance.    Time  6    Period  Weeks    Target Date  02/23/18      PT LONG TERM GOAL #4   Title  The patient will return to work activities x 6-8 hours/day to demo improved tolerance to activity.    Time  6    Period  Weeks    Target Date  02/23/18      PT LONG TERM GOAL #5   Title  The patient will returnt to modified gym/workout routine for community fitness.    Time  6    Period  Weeks    Target Date  02/23/18            Plan - 01/16/18 1356    Clinical Impression Statement  The patient is tolerating HEP for dizziness, balance and walking with some intermittent increase in HA.  PT and patinet have discussed resting to limit increase in HA.  She has also contacted MD to discuss further medical mgmt.  She did note a reduction  in HA symptoms today with cervical spine activities.  therefore, PT added some stretching and stabilization of neck to help reduce neckk tension.  Plan to reduce frequency to 1x/week to allow time between sessions for HEP.     PT Treatment/Interventions  ADLs/Self Care Home Management;Canalith Repostioning;Gait training;Patient/family education;Therapeutic exercise;Neuromuscular re-education;Balance training;Vestibular    PT Next Visit Plan  cervical stretching, stabiization, progress gaze as able, dynamic gait/ball toss activities, discuss walking program and exertional dizziness progression as able.    Consulted and Agree with Plan of Care  Patient       Patient  will benefit from skilled therapeutic intervention in order to improve the following deficits and impairments:  Decreased activity tolerance, Decreased balance, Dizziness, Impaired vision/preception  Visit Diagnosis: Dizziness and giddiness  BPPV (benign paroxysmal positional vertigo), right     Problem List Patient Active Problem List   Diagnosis Date Noted  . Post concussion syndrome 01/01/2018  . BPPV (benign paroxysmal positional vertigo), bilateral 01/01/2018  . Post-traumatic headache 01/01/2018  . Sports hernia 09/05/2017  . Pelvic pain in female 08/14/2017    Jigar Zielke, PT 01/16/2018, 1:59 PM   [facility] [address] [place], [place], [address] Phone: [phone]   Fax:  [phone]  Name: Angelica Yu MRN: [id number] Date of Birth: [birth year]

## 2018-01-17 ENCOUNTER — Telehealth: Payer: Self-pay | Admitting: *Deleted

## 2018-01-17 DIAGNOSIS — F0781 Postconcussional syndrome: Secondary | ICD-10-CM

## 2018-01-17 DIAGNOSIS — G44309 Post-traumatic headache, unspecified, not intractable: Secondary | ICD-10-CM

## 2018-01-17 NOTE — Telephone Encounter (Signed)
Patient left a message stating she sent a Mychart message to Dr. Riley KillSwartz on Monday regarding Topamax, Dosing question, possible increase.  Patient concerned she has not heard back

## 2018-01-18 MED ORDER — TOPIRAMATE 25 MG PO TABS
50.0000 mg | ORAL_TABLET | Freq: Every day | ORAL | 2 refills | Status: DC
Start: 2018-01-18 — End: 2018-02-04

## 2018-01-18 NOTE — Telephone Encounter (Signed)
Spoke with pt and increased topamax to 50mg  qhs.

## 2018-01-22 ENCOUNTER — Ambulatory Visit: Payer: No Typology Code available for payment source | Admitting: Physical Therapy

## 2018-01-23 ENCOUNTER — Ambulatory Visit: Payer: No Typology Code available for payment source | Admitting: Physical Therapy

## 2018-01-25 ENCOUNTER — Encounter: Payer: Self-pay | Admitting: Physical Therapy

## 2018-01-25 ENCOUNTER — Ambulatory Visit: Payer: No Typology Code available for payment source | Admitting: Physical Therapy

## 2018-01-25 DIAGNOSIS — H8111 Benign paroxysmal vertigo, right ear: Secondary | ICD-10-CM

## 2018-01-25 DIAGNOSIS — R42 Dizziness and giddiness: Secondary | ICD-10-CM

## 2018-01-26 NOTE — Therapy (Signed)
[facility] [address] [place], [place], [address] Phone: [phone]   Fax:  [phone]  Physical Therapy Treatment  Patient Details  Name: Angelica Yu MRN: [id number] Date of Birth: Dec 07, 1995 Referring Provider: Faith Rogue, MD   Encounter Date: 01/25/2018  PT End of Session - 01/26/18 1537    Visit Number  5 eval + 3 visits    Number of Visits  8    Date for PT Re-Evaluation  02/23/18    Authorization Type  worker's comp:  Fax to [employer], claim specalist at [employer] [phone]; Phone number [phone]  ** 6 SESSIONS FROM 4/15-5/10/19.  ? Gave V.O. on phone to approve visit on 01/10/18?   (eval on 4/10, + 6 treatments).    Authorization - Visit Number  4    Authorization - Number of Visits  6    PT Start Time  1021    PT Stop Time  1108    PT Time Calculation (min)  47 min    Activity Tolerance  Patient tolerated treatment well    Behavior During Therapy  WFL for tasks assessed/performed       Past Medical History:  Diagnosis Date  . Concussion 10/23/2017  . Sports hernia     Past Surgical History:  Procedure Laterality Date  . adnoids    . TONSILLECTOMY      There were no vitals filed for this visit.  Subjective Assessment - 01/25/18 1027    Subjective  Pt still not sleeping well and has increased activity at work and is working on [employer]. Continues to walk, performs balance, neck/back exercises and rolling every day.  Horizontal VOR causes a significant increase in symptoms for long duration.  Feeling slightly more dizzy and still has HA today.    Patient Stated Goals  "Get back to a normal lifestyle".  She notes she has not been able to get back to her activity level due to prior spors hernia and the nconcussion.     Currently in Pain?  Yes    Pain Score  4     Pain Location  Head    Pain Orientation  Right    Pain Descriptors / Indicators  Headache    Pain Onset  More  than a month ago             Vestibular Assessment - 01/25/18 1032      Vestibular Assessment   General Observation  4/10 today; HA, foggy feeling today.  Driving continues to exacerbate symptoms due to turning her head and objects in peripheral vision      Horizontal Canal Right   Horizontal Canal Right Duration  0    Horizontal Canal Right Symptoms  Normal      Horizontal Canal Left   Horizontal Canal Left Duration  0    Horizontal Canal Left Symptoms  Normal               [facility] Adult PT Treatment/Exercise - 01/26/18 1530      Therapeutic Activites    Therapeutic Activities  Other Therapeutic Activities    Other Therapeutic Activities  Discussed pt's current work schedule, activity level and life stressors and modification of exercises to minimize over-exertion and exacerbation of headache and dizziness intensity each day.  Also discussed role of posture and neck position during x 1 viewing and effect on symptoms.  Pt reporting pain in low back when performing towel roll stretch; pt demonstrated  and discussed positioning (knees up, UE in goal post position) and activation of core for improved alignment of lumbar spine; also discussed second towel roll horizontal to support lumbar spine.  Pt also asking questions about plan for further therapy visits once her 6 approved were completed; discussed sequence of re-assessment and process of requesting further visits but will defer final decision to primary PT      Vestibular Treatment/Exercise - 01/25/18 1039      Vestibular Treatment/Exercise   Vestibular Treatment Provided  Habituation;Gaze    Gaze Exercises  X1 Viewing Horizontal;X1 Viewing Vertical      X1 Viewing Horizontal   Foot Position  standing feet apart    Reps  10    Comments  verbal cues for technique and neck ROM      X1 Viewing Vertical   Foot Position  standing feet apart    Reps  10    Comments  verbal cues for neck position and to minimize forward  head and for neck ROM            PT Education - 01/26/18 1536    Education provided  Yes    Education Details  See TA section; pt to attempt 10 repetitions horizontal x 1 viewing with neck in proper alignment and with modifications to technique    Person(s) Educated  Patient    Methods  Explanation    Comprehension  Verbalized understanding       PT Short Term Goals - 01/09/18 1354      PT SHORT TERM GOAL #1   Title  The patient will return demo HEP for gaze adaptation, habituation, high level balance.    Time  4    Period  Weeks    Target Date  02/08/18      PT SHORT TERM GOAL #2   Title  The patient will improve gaze x 1 adaptation from tolerating 5 reps up to 30 seconds with change in dizziness < or equal to 2/10.    Baseline  Dizziness increases from 4/10 up to 8-9/10 with slow gaze x 1.    Time  4    Period  Weeks    Target Date  02/08/18      PT SHORT TERM GOAL #3   Title  The patient will be assessed on buffalo concussion treadmill test and LTG to follow.    Time  4    Period  Weeks    Target Date  02/08/18      PT SHORT TERM GOAL #4   Title  The patient will tolerate rolling supine<>bilateral sidelying with no increase in subjective reports of dizziness.    Time  4    Period  Weeks    Target Date  02/08/18        PT Long Term Goals - 01/09/18 1416      PT LONG TERM GOAL #1   Title  The patient will improve functional status score from 67% to > or equal to 78% to demo improving functional mobility.    Time  6    Period  Weeks    Target Date  02/23/18      PT LONG TERM GOAL #2   Title  The patient will tolerate gaze x 1 x 1 minute without subjective reports of dizziness to demo improving tolerance to head motion.    Time  6    Period  Weeks    Target Date  02/23/18  PT LONG TERM GOAL #3   Title  The patient will be indep with HEP progression for gaze, motion sensitivity, exercise tolerance.    Time  6    Period  Weeks    Target Date   02/23/18      PT LONG TERM GOAL #4   Title  The patient will return to work activities x 6-8 hours/day to demo improved tolerance to activity.    Time  6    Period  Weeks    Target Date  02/23/18      PT LONG TERM GOAL #5   Title  The patient will returnt to modified gym/workout routine for community fitness.    Time  6    Period  Weeks    Target Date  02/23/18            Plan - 01/26/18 1537    Clinical Impression Statement  Treatment session focused on answering patient's questions about symptoms, modification of vestibular and shoulder/neck exercises for HEP, review of x 1 viewing and discussion regarding plan for therapy visits.  During reassessment of horizontal roll test pt did not demonstrate symptoms of vertigo or nystagmus; pt advised to cease this exercise for now.  Pt tolerated well with mild increase in symptoms that returned to baseline with intermittent rest breaks.      PT Treatment/Interventions  ADLs/Self Care Home Management;Canalith Repostioning;Gait training;Patient/family education;Therapeutic exercise;Neuromuscular re-education;Balance training;Vestibular    PT Next Visit Plan  Posture, cervical stretching, stabiization, progress gaze as able, dynamic gait/ball toss activities, discuss walking program and exertional dizziness progression as able.    Consulted and Agree with Plan of Care  Patient       Patient will benefit from skilled therapeutic intervention in order to improve the following deficits and impairments:  Decreased activity tolerance, Decreased balance, Dizziness, Impaired vision/preception  Visit Diagnosis: Dizziness and giddiness  BPPV (benign paroxysmal positional vertigo), right     Problem List Patient Active Problem List   Diagnosis Date Noted  . Post concussion syndrome 01/01/2018  . BPPV (benign paroxysmal positional vertigo), bilateral 01/01/2018  . Post-traumatic headache 01/01/2018  . Sports hernia 09/05/2017  . Pelvic pain  in female 08/14/2017   Dierdre Highman, PT, DPT 01/26/18    3:42 PM    [facility] [address] [place], [place], [address] Phone: [phone]   Fax:  [phone]  Name: Arnitra Sokoloski MRN: [id number] Date of Birth: November 08, 1995

## 2018-01-30 ENCOUNTER — Encounter: Payer: Self-pay | Admitting: Rehabilitative and Restorative Service Providers"

## 2018-02-01 ENCOUNTER — Encounter: Payer: Self-pay | Admitting: Rehabilitative and Restorative Service Providers"

## 2018-02-01 ENCOUNTER — Ambulatory Visit
Payer: No Typology Code available for payment source | Attending: Nurse Practitioner | Admitting: Rehabilitative and Restorative Service Providers"

## 2018-02-01 DIAGNOSIS — H8111 Benign paroxysmal vertigo, right ear: Secondary | ICD-10-CM

## 2018-02-01 DIAGNOSIS — R42 Dizziness and giddiness: Secondary | ICD-10-CM | POA: Diagnosis not present

## 2018-02-01 NOTE — Therapy (Signed)
[place] [address] [place], [place], [address] Phone: [phone]   Fax:  [phone]  Physical Therapy Treatment  Patient Details  Name: Angelica Yu MRN: [id number] Date of Birth: 19-Mar-1996 Referring Provider: Alger Simons, MD   Encounter Date: 02/01/2018  PT End of Session - 02/01/18 1031    Visit Number  6 eval + 5 visits    Number of Visits  8    Date for PT Re-Evaluation  02/23/18    Authorization Type  worker's comp:  Fax to [employer], claim specalist at [employer] [phone]; Phone number [phone]  ** 6 SESSIONS FROM 4/15-5/10/19.  ? Gave V.O. on phone to approve visit on 01/10/18?   (eval on 4/10, + 6 treatments).    Authorization - Visit Number  5 visits    Authorization - Number of Visits  6    PT Start Time  1023    PT Stop Time  1103    PT Time Calculation (min)  40 min    Activity Tolerance  Patient tolerated treatment well    Behavior During Therapy  WFL for tasks assessed/performed       Past Medical History:  Diagnosis Date  . Concussion 10/23/2017  . Sports hernia     Past Surgical History:  Procedure Laterality Date  . adnoids    . TONSILLECTOMY      There were no vitals filed for this visit.  Subjective Assessment - 02/01/18 1023    Subjective  The patient reports she tolerated therapy well last week.  "I hit a wall that afternoon."  Patient notes she got a bad headache last Friday, got fogginess, felt confusion.  She called her neurologist and has not heard back.   She felt she did a lot last week and felt it all caught up to her on Friday.  The HA got severe and she slept for 2.5 hours and then started to feel better.  "I have had consistent headaches every day t his week."  She feels this may be attributed to a couple hours/day of working on applications for [employer] school.   Her headaches are different this week, describing bad pressure inside her head and more focal pain over  site of injury.    Gaze exercises are less provoking from dizzy standpoint, but still provoke HA (but HA are more severe this week).    Patient notes she plans to move by early June to [facility] and inquires about need for further therapy.    Patient Stated Goals  "Get back to a normal lifestyle".  She notes she has not been able to get back to her activity level due to prior spors hernia and the nconcussion.     Currently in Pain?  Yes No headache at rest, but head pain at site of injury.    Pain Score  7     Pain Location  Head    Pain Orientation  Right    Pain Descriptors / Indicators  Shooting;Burning;Tender    Pain Type  Acute pain    Pain Onset  1 to 4 weeks ago was there initially, but got worse last week.    Pain Frequency  Intermittent    Aggravating Factors   worse when HA present    Pain Relieving Factors  rest, sleep                       [facility] Adult PT Treatment/Exercise -  02/01/18 1337      Exercises   Exercises  Other Exercises    Other Exercises   PRONE:  on elbows x 1 minutes, plank on elbows with cues on lumbar positioning, standing chin tuck with cues on posture in front of mirror; supine chin tuck for review, prone "superman" unilateral lifts for parascapular stability.      Manual Therapy   Manual Therapy  Joint mobilization;Soft tissue mobilization;Manual Traction    Manual therapy comments  to reduce cervical guarding    Joint Mobilization  lateral glides mid and upper c-spine,     Soft tissue mobilization  upper trap, scalenes and suboccipital release    Manual Traction  gentle manual traction supine      Vestibular Treatment/Exercise - 02/01/18 1054      Vestibular Treatment/Exercise   Vestibular Treatment Provided  Habituation;Gaze    Habituation Exercises  Standing Horizontal Head Turns;Standing Vertical Head Turns;Standing Diagonal Head Turns    Gaze Exercises  X1 Viewing Horizontal;X1 Viewing Vertical      Standing Horizontal Head  Turns   Number of Reps   5    Symptom Description   On compliant surfaces with eyes open with minimal increase in dizziness.      Standing Vertical Head Turns   Number of Reps   5    Symptom Description   On compliant surfaces with eyes open with minimal increase in dizziness      Standing Diagonal Head Turns   Number of Reps   5    Symptiom Description   to each direction while on compliant surfaces with minimal c/o dizziness.      X1 Viewing Horizontal   Foot Position  standing feet apart    Reps  10    Comments  patient notes improved tolerance to gaze x 1      X1 Viewing Vertical   Foot Position  standing feet apart    Reps  10    Comments  patient notes improved tolerance with gaze activities; no increase in headache.            PT Education - 02/01/18 1332    Education provided  Yes    Education Details  Discussed need for continued therapy as HAs become more manageable.  At this time, HAs are barrier for further progression of HEP.  PT also discussed that increased stress may be a contributing factor for HAs.  Provided handout for [facility] consussion program.    Person(s) Educated  Patient    Methods  Explanation;Demonstration    Comprehension  Verbalized understanding       PT Short Term Goals - 02/01/18 1334      PT SHORT TERM GOAL #1   Title  The patient will return demo HEP for gaze adaptation, habituation, high level balance.    Time  4    Period  Weeks    Status  Achieved      PT SHORT TERM GOAL #2   Title  The patient will improve gaze x 1 adaptation from tolerating 5 reps up to 30 seconds with change in dizziness < or equal to 2/10.    Baseline  Dizziness increases from 4/10 up to 8-9/10 with slow gaze x 1.    Time  4    Period  Weeks    Status  On-going    Target Date  02/08/18      PT SHORT TERM GOAL #3   Title  The patient  will be assessed on [place]alo concussion treadmill test and LTG to follow.    Time  4    Period  Weeks    Status  Achieved       PT SHORT TERM GOAL #4   Title  The patient will tolerate rolling supine<>bilateral sidelying with no increase in subjective reports of dizziness.    Baseline  per subjective report -- patient no longer performing rolling habituation as this is improved.    Time  4    Period  Weeks    Status  Achieved        PT Long Term Goals - 01/09/18 1416      PT LONG TERM GOAL #1   Title  The patient will improve functional status score from 67% to > or equal to 78% to demo improving functional mobility.    Time  6    Period  Weeks    Target Date  02/23/18      PT LONG TERM GOAL #2   Title  The patient will tolerate gaze x 1 x 1 minute without subjective reports of dizziness to demo improving tolerance to head motion.    Time  6    Period  Weeks    Target Date  02/23/18      PT LONG TERM GOAL #3   Title  The patient will be indep with HEP progression for gaze, motion sensitivity, exercise tolerance.    Time  6    Period  Weeks    Target Date  02/23/18      PT LONG TERM GOAL #4   Title  The patient will return to work activities x 6-8 hours/day to demo improved tolerance to activity.    Time  6    Period  Weeks    Target Date  02/23/18      PT LONG TERM GOAL #5   Title  The patient will returnt to modified gym/workout routine for community fitness.    Time  6    Period  Weeks    Target Date  02/23/18            Plan - 02/01/18 1338    Clinical Impression Statement  The patient has met 3 STGs.  Her headaches are worsening this week and we discussed multiple contributing factors of stress, upcoming life changes (moving, wedding, school applications), and increased work schedule.  PT and patient discussed not progressing current HEP further until HAs are no longer worsening.  Will continue current plan and f/u on 5/8 prior to her vacation to [place].  Anticipate that we will end therapy on 5/8 with upcoming move/relocation to [facility].  PT provided resources for clinic in  [place].     PT Treatment/Interventions  ADLs/Self Care Home Management;Canalith Repostioning;Gait training;Patient/family education;Therapeutic exercise;Neuromuscular re-education;Balance training;Vestibular    PT Next Visit Plan  Check STGs/LTGs, discuss progression of HEP as able.    Consulted and Agree with Plan of Care  Patient       Patient will benefit from skilled therapeutic intervention in order to improve the following deficits and impairments:  Decreased activity tolerance, Decreased balance, Dizziness, Impaired vision/preception  Visit Diagnosis: Dizziness and giddiness  BPPV (benign paroxysmal positional vertigo), right     Problem List Patient Active Problem List   Diagnosis Date Noted  . Post concussion syndrome 01/01/2018  . BPPV (benign paroxysmal positional vertigo), bilateral 01/01/2018  . Post-traumatic headache 01/01/2018  . Sports hernia 09/05/2017  . Pelvic pain  in female 08/14/2017    Rudell Cobb, PT 02/01/2018, 1:42 PM  [place] [address] [place], [place], [address] Phone: [phone]   Fax:  [phone]  Name: Angelica Yu MRN: [id number] Date of Birth: 02/16/1996

## 2018-02-04 ENCOUNTER — Encounter: Payer: Self-pay | Admitting: Rehabilitative and Restorative Service Providers"

## 2018-02-04 ENCOUNTER — Encounter: Payer: Self-pay | Admitting: Physical Medicine & Rehabilitation

## 2018-02-04 ENCOUNTER — Encounter
Payer: No Typology Code available for payment source | Attending: Physical Medicine & Rehabilitation | Admitting: Physical Medicine & Rehabilitation

## 2018-02-04 DIAGNOSIS — F0781 Postconcussional syndrome: Secondary | ICD-10-CM | POA: Insufficient documentation

## 2018-02-04 DIAGNOSIS — Z79899 Other long term (current) drug therapy: Secondary | ICD-10-CM | POA: Diagnosis not present

## 2018-02-04 DIAGNOSIS — G44309 Post-traumatic headache, unspecified, not intractable: Secondary | ICD-10-CM

## 2018-02-04 DIAGNOSIS — H8110 Benign paroxysmal vertigo, unspecified ear: Secondary | ICD-10-CM | POA: Insufficient documentation

## 2018-02-04 DIAGNOSIS — G479 Sleep disorder, unspecified: Secondary | ICD-10-CM | POA: Diagnosis not present

## 2018-02-04 DIAGNOSIS — R51 Headache: Secondary | ICD-10-CM | POA: Diagnosis present

## 2018-02-04 MED ORDER — TOPIRAMATE 25 MG PO TABS: 25.0000 mg | ORAL_TABLET | ORAL | 2 refills | Status: AC

## 2018-02-04 NOTE — Patient Instructions (Addendum)
PRACTICE GOOD SLEEP HYGIENE  DON'T FORGET TO USE TYLENOL OR IBUPROFEN FOR HEADACHES TOO  WE CAN LOOK AT MEDICATION ADJUSTMENTS ONCE YOU MOVE DEPENDING UPON HOW YOUR HEADACHES ARE DOING.

## 2018-02-04 NOTE — Progress Notes (Signed)
Subjective:    Patient ID: Angelica Yu, female    DOB: 09-28-1996, 22 y.o.   MRN: [id number]  HPI   Angelica Yu is here in follow up of her PCS. She has had ongoing headaches which seem to be more related to physical and mental fatigue. She is also having them more at the exact site of her injury. She has continued to work while going to therapy. She is also in the process of applications for school and is in the process of moving as her husband got a job in the [place] area.   We increased her topamax around 4/19 which seems to have helped her headache intensity somewhat. She noticed some tingling in her hands but overall has tolerated it well. It hasn't made her sleepy. She finds that she still struggles to fall asleep a times although once she gets to sleep, she sleeps around 9 hours.   Therapy has been helpful for vestibular symptoms but her headaches have been limiting at times.      Pain Inventory Average Pain 4 Pain Right Now 6 My pain is sharp, burning and dull  In the last 24 hours, has pain interfered with the following? General activity 5 Relation with others 3 Enjoyment of life 5 What TIME of day is your pain at its worst? evening Sleep (in general) Fair  Pain is worse with: some activites Pain improves with: rest Relief from Meds: unsure  Mobility walk without assistance how many minutes can you walk? 10 ability to climb steps?  yes do you drive?  yes Do you have any goals in this area?  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  yes  Neuro/Psych tingling dizziness  Prior Studies Any changes since last visit?  no  Physicians involved in your care Any changes since last visit?  no   Family History  Problem Relation Age of Onset  . Cancer Paternal Grandfather   . Alcohol abuse Paternal Grandfather   . Cancer Maternal Grandmother        stomach  . Breast cancer Maternal Grandmother  64  . Early death Maternal Grandfather   . Kidney disease Paternal Grandmother   . Diabetes Neg Hx   . Hypertension Neg Hx    Social History   Socioeconomic History  . Marital status: Significant Other    Spouse name: Not on file  . Number of children: Not on file  . Years of education: Not on file  . Highest education level: Not on file  Occupational History  . Not on file  Social Needs  . Financial resource strain: Not on file  . Food insecurity:    Worry: Not on file    Inability: Not on file  . Transportation needs:    Medical: Not on file    Non-medical: Not on file  Tobacco Use  . Smoking status: Never Smoker  . Smokeless tobacco: Never Used  Substance and Sexual Activity  . Alcohol use: Not Currently  . Drug use: No  . Sexual activity: Yes    Birth control/protection: Condom  Lifestyle  . Physical activity:    Days per week: Not on file    Minutes per session: Not on file  . Stress: Not on file  Relationships  . Social connections:    Talks on phone: Not on file    Gets together: Not on file    Attends religious  service: Not on file    Active member of club or organization: Not on file    Attends meetings of clubs or organizations: Not on file    Relationship status: Not on file  Other Topics Concern  . Not on file  Social History Narrative  . Not on file   Past Surgical History:  Procedure Laterality Date  . adnoids    . TONSILLECTOMY     Past Medical History:  Diagnosis Date  . Concussion 10/23/2017  . Sports hernia    BP 121/82 (BP Location: Left Arm, Patient Position: Sitting, Cuff Size: Normal)   Pulse (!) 105   Resp 14   Ht  (1.778 m)   Wt 137 lb (62.1 kg)   SpO2 98%   BMI 19.66 kg/m   Opioid Risk Score:   Fall Risk Score:  `1  Depression screen PHQ 2/9  Depression screen [facility] 2/9 01/02/2018 01/01/2018  Decreased Interest 0 0  Down, Depressed, Hopeless 0 1  PHQ - 2 Score 0 1    Review of Systems  Constitutional: Negative.    HENT: Negative.   Eyes: Negative.   Respiratory: Negative.   Cardiovascular: Negative.   Gastrointestinal: Negative.   Endocrine: Negative.   Genitourinary: Negative.   Musculoskeletal: Negative.   Skin: Negative.   Allergic/Immunologic: Negative.   Neurological: Positive for dizziness and headaches.       Tingling   Hematological: Negative.        Objective:   Physical Exam  General: No acute distress HEENT: EOMI, oral membranes moist Cards: reg rate  Chest: normal effort Abdomen: Soft, NT, ND Skin: dry, intact Extremities: no edema Skin: Clean and intact without signs of breakdown Neuro: fair insight and attention. Does still become distracted at times. No vestibular testing today. Motor 5/5. Sensory normal.  Musculoskeletal: Full ROM, No pain with AROM or PROM in the neck, trunk, or extremities. Posture appropriate Psych: sl anxious but very pleasant.          Assessment & Plan:  1. 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 No LOC   Plan: 1.  Continue with [facility] outpatient physical therapy for vestibular treatment.  She will need ongoing therapy in [place]. I would expect her to feel better once her schedule settles down a bit. I think once she can focus just on her therapy, she will excel.  2.  Will titrate topamax to  in AM and  in PM. Consider further titration or change to [place]. She may also use tylenol or ibuprofen for pain. 3.  Discussed improved sleep hygiene. Needs to work on an environment conducive for sleep. Shouldn't need medicine for sleep. 4.   Recommend ongoing treatment of concussion in [place]. She should be able to find excellent care at [facility].  6.  I will see her back as needed.  Today I spent 15 minutes in direct patient care.  All questions were encouraged and answered.

## 2018-02-06 ENCOUNTER — Ambulatory Visit: Payer: No Typology Code available for payment source | Admitting: Rehabilitative and Restorative Service Providers"

## 2018-02-06 ENCOUNTER — Encounter: Payer: Self-pay | Admitting: Rehabilitative and Restorative Service Providers"

## 2018-02-06 DIAGNOSIS — R42 Dizziness and giddiness: Secondary | ICD-10-CM | POA: Diagnosis not present

## 2018-02-06 NOTE — Therapy (Signed)
[facility] [address] [place], [place], [address] Phone: [phone]   Fax:  [phone]  Physical Therapy Treatment and Discharge Summary  Patient Details  Name: Angelica Yu MRN: [id number] Date of Birth: 1996/03/14 Referring Provider: Alger Simons, MD   Encounter Date: 02/06/2018  PT End of Session - 02/06/18 0921    Visit Number  7 eval + 5 visits    Number of Visits  8    Date for PT Re-Evaluation  02/23/18    Authorization Type  worker's comp:  Fax to [employer], claim specalist at [employer] [phone]; Phone number [phone]  ** 6 SESSIONS FROM 4/15-5/10/19.  ? Gave V.O. on phone to approve visit on 01/10/18?   (eval on 4/10, + 6 treatments).    Authorization - Visit Number  6 visits    Authorization - Number of Visits  6    PT Start Time  0845    PT Stop Time  0930    PT Time Calculation (min)  45 min    Activity Tolerance  Patient tolerated treatment well    Behavior During Therapy  WFL for tasks assessed/performed       Past Medical History:  Diagnosis Date  . Concussion 10/23/2017  . Sports hernia     Past Surgical History:  Procedure Laterality Date  . adnoids    . TONSILLECTOMY      There were no vitals filed for this visit.  Subjective Assessment - 02/06/18 0852    Subjective  The patient saw Dr. Naaman Plummer on Monday and ends work schedule this Friday.   Post exercise she notes mild dizziness, but has not noticed significant dizziness.  She reports worse rating of dizziness as a 5/10 (versus the 8/10 she used to get).  She notes driving still provokes symtpoms.      Patient Stated Goals  "Get back to a normal lifestyle".  She notes she has not been able to get back to her activity level due to prior spors hernia and the nconcussion.     Currently in Pain?  Yes    Pain Score  -- "a little bit of a headache"    Pain Location  Head    Pain Orientation  Right    Pain Descriptors / Indicators   Tender    Pain Type  Acute pain    Pain Onset  1 to 4 weeks ago    Pain Frequency  Intermittent    Aggravating Factors   rushing, driving    Pain Relieving Factors  rest, sleep             Vestibular Assessment - 02/06/18 0858      Vestibular Assessment   General Observation  1/10 today- very mild to no dizziness at rest.               [facility] Adult PT Treatment/Exercise - 02/06/18 1048      Self-Care   Self-Care  Other Self-Care Comments    Other Self-Care Comments   Discussed continuing 20 minutes at lower HR to work on exertional dizziness.  Recommended continuation of gaze and balance activities.  Patient to seek services in [place] once moved to new location.      Vestibular Treatment/Exercise - 02/06/18 2774      Vestibular Treatment/Exercise   Vestibular Treatment Provided  Gaze    Gaze Exercises  X1 Viewing Horizontal;X1 Viewing Vertical      X1 Viewing Horizontal   Foot  Position  Standing feet apart    Comments  30 seconds with symptoms increasing from 1/10 up to 4/10.        X1 Viewing Vertical   Foot Position  Standing feet apart    Comments  30 seconds with less symptoms than side to side         Min HR RPE Overall condition (Likert Scale) Symptoms/Observations  REST      Begin _0 .6 mph for 5'5"; 3.2 mph for <5'5"; begin at 0 degrees  0 109 7 2/10 Slightly dizzy, minor headache (lower than typical); pressure HA  1 115 8 3/10   2 125 10 3/10   3 128 11 3/10   4 132 12 3/10 Breathing is becoming labored  5 130 13 3/10   6 139 13 4/10 Headache getting a little bit worse  7 146 14 6/10 Headache getting worse  _1 With 15 degree incline reached, begin increasing speed 0.4 mph/min  _2 Post-Exercise (reduce speed to 2.5 mph x 2 minute cool down)  1 129 12 6/10 Headache, pressure, dizziness increasing  2 120 12 6/10          PT  Education - 02/06/18 1045    Education provided  Yes    Education Details  Discussed continuation of HEP and walking program maintaining HE in low 100s based on treadmill test.     Person(s) Educated  Patient    Methods  Explanation    Comprehension  Verbalized understanding       PT Short Term Goals - 02/06/18 0904      PT SHORT TERM GOAL #1   Title  The patient will return demo HEP for gaze adaptation, habituation, high level balance.    Time  4    Period  Weeks    Status  Achieved      PT SHORT TERM GOAL #2   Title  The patient will improve gaze x 1 adaptation from tolerating 5 reps up to 30 seconds with change in dizziness < or equal to 2/10.    Baseline  Dizziness increases from 4/10 up to 8-9/10 with slow gaze x 1 (at eval).  02/06/2018:  can tolerate 30 seconds with symtpoms increasing from 1/10 up to 4/10.    Time  4    Period  Weeks    Status  Partially Met      PT SHORT TERM GOAL #3   Title  The patient will be assessed on [place] concussion treadmill test and LTG to follow.    Time  4    Period  Weeks    Status  Achieved      PT SHORT TERM GOAL #4   Title  The patient will tolerate rolling supine<>bilateral sidelying with no increase in subjective reports of dizziness.    Baseline  per subjective report -- patient no longer performing rolling habituation as this is improved.    Time  4    Period  Weeks    Status  Achieved  PT Long Term Goals - 02/06/18 0857      PT LONG TERM GOAL #1   Title  The patient will improve functional status score from 67% to > or equal to 78% to demo improving functional mobility.    Baseline  Patient ending therapy early due to moving- did not capture.     Time  6    Period  Weeks    Status  Deferred      PT LONG TERM GOAL #2   Title  The patient will tolerate gaze x 1 x 1 minute without subjective reports of dizziness to demo improving tolerance to head motion.    Baseline  Tolerates 30 seconds.    Time  6    Period   Weeks    Status  Not Met      PT LONG TERM GOAL #3   Title  The patient will be indep with HEP progression for gaze, motion sensitivity, exercise tolerance.    Time  6    Period  Weeks    Status  Achieved      PT LONG TERM GOAL #4   Title  The patient will return to work activities x 6-8 hours/day to demo improved tolerance to activity.    Baseline  Stayed at 4 hour shifts.    Time  6    Period  Weeks    Status  Not Met      PT LONG TERM GOAL #5   Title  The patient will returnt to modified gym/workout routine for community fitness.    Time  6    Period  Weeks    Status  Not Met            Plan - 02/06/18 0925    Clinical Impression Statement  The patient notes that her dizziness is not as limiting during daily tasks.  It is usually worse when she stops driving or stops movement.  Her main impairments are exertional dizziness and continued headache.  Patient has HEP to continue to work on while moving and taking time away from therapy.  Stress with upcoming school applications, move, work, etc may be contributing factors to persistent headaches.  PT discussed to maintain current program as progression not indicated due to continued symptoms of daily HA.     PT Treatment/Interventions  ADLs/Self Care Home Management;Canalith Repostioning;Gait training;Patient/family education;Therapeutic exercise;Neuromuscular re-education;Balance training;Vestibular    PT Next Visit Plan  Discharge from PT at our clinic today- recommend further services after move.    Recommended Other Services  *Patient will benefit from getting established at [facility] concussion clinic upon moving to [facility].  MD services will be needed to manage headaches, and PT services to progress activity to tolerance.    Consulted and Agree with Plan of Care  Patient       Patient will benefit from skilled therapeutic intervention in order to improve the following deficits and impairments:  Decreased activity tolerance,  Decreased balance, Dizziness, Impaired vision/preception  Visit Diagnosis: Dizziness and giddiness  PHYSICAL THERAPY DISCHARGE SUMMARY  Visits from Start of Care: 7   Current functional level related to goals / functional outcomes: See above   Remaining deficits: Continued symptoms of exertional dizziness, persistent headaches   Education / Equipment: Home program, activity restrictions, activity recommendations, stress management.  Plan: Patient agrees to discharge.  Patient goals were partially met. Patient is being discharged due to                                                     ?????  Patient moving to [facility].        Thank you for the referral of this patient. Rudell Cobb, MPT   [place], PT 02/06/2018, 10:51 AM  [facility] [address] [place], [place], [address] Phone: [phone]   Fax:  [phone]  Name: Herlinda Heady MRN: [id number] Date of Birth: 10-15-1995

## 2018-02-07 ENCOUNTER — Encounter: Payer: Self-pay | Admitting: Rehabilitative and Restorative Service Providers"

## 2018-02-08 ENCOUNTER — Encounter: Payer: Self-pay | Admitting: Rehabilitative and Restorative Service Providers"

## 2018-04-09 ENCOUNTER — Encounter: Payer: Self-pay | Admitting: Family Medicine

## 2019-04-03 IMAGING — US US PELVIS COMPLETE
1 series · 13 of 25 positions shown · non-contrast
Comparison: None

CLINICAL DATA: Initial evaluation for intermittent right pelvic
pain for 1-2 weeks.

EXAM:
TRANSABDOMINAL AND TRANSVAGINAL ULTRASOUND OF PELVIS
TECHNIQUE: Both transabdominal and transvaginal ultrasound examinations of the
pelvis were performed. Transabdominal technique was performed for
global imaging of the pelvis including uterus, ovaries, adnexal
regions, and pelvic cul-de-sac. It was necessary to proceed with
endovaginal exam following the transabdominal exam to visualize the
uterus and ovaries.

[Series 1: us pelvis complete · 0.27mm/px · 13 of 56 slices shown]
[im 1/56]
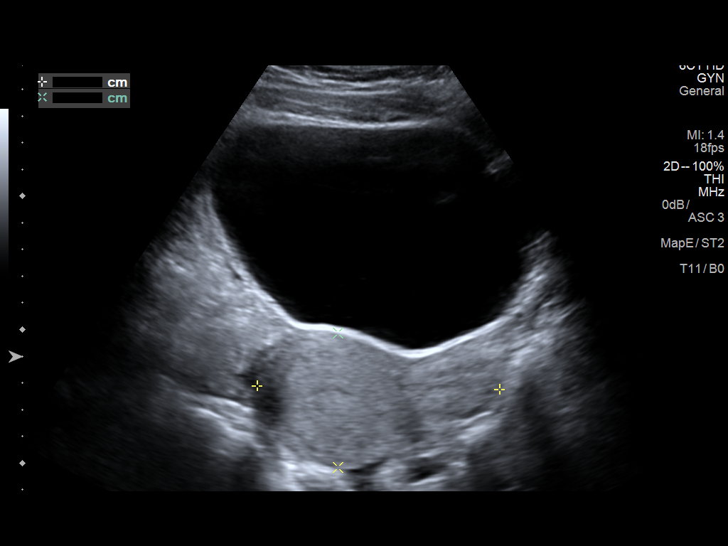
[im 5/56]
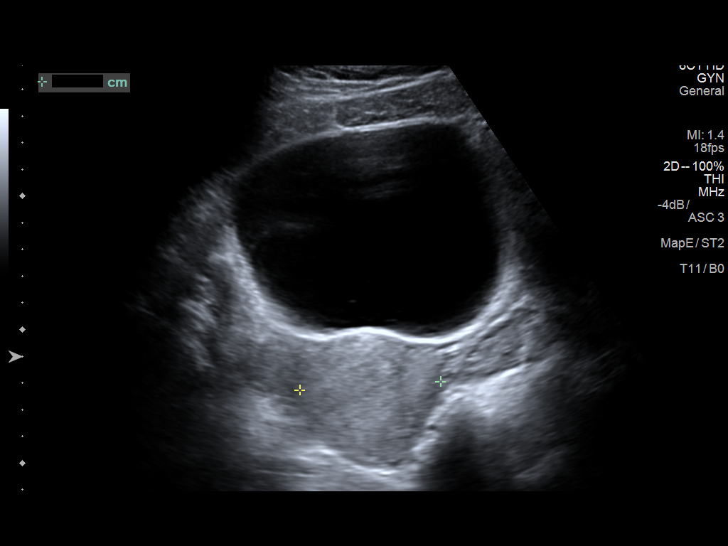
[im 10/56]
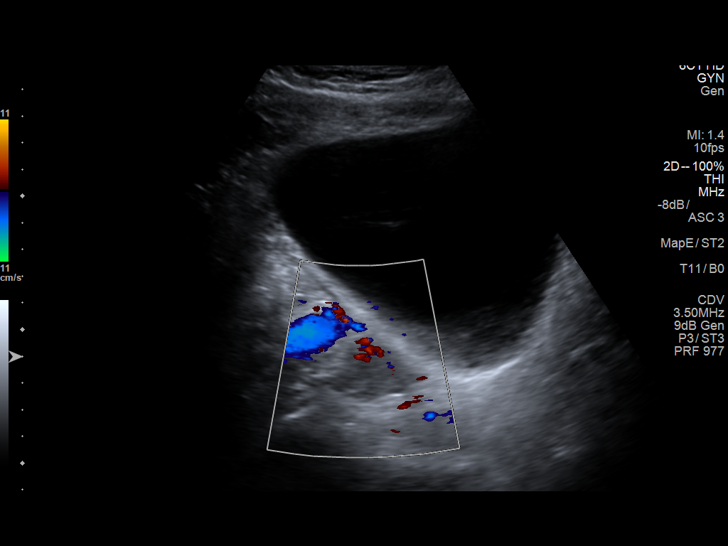
[im 14/56]
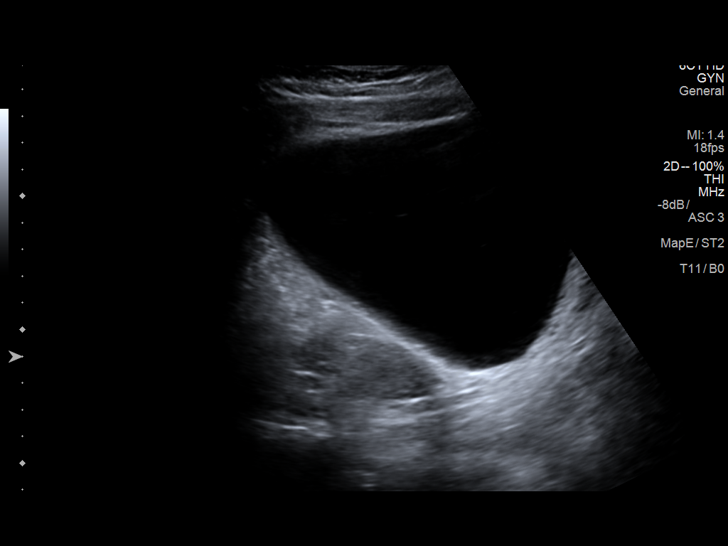
[im 19/56]
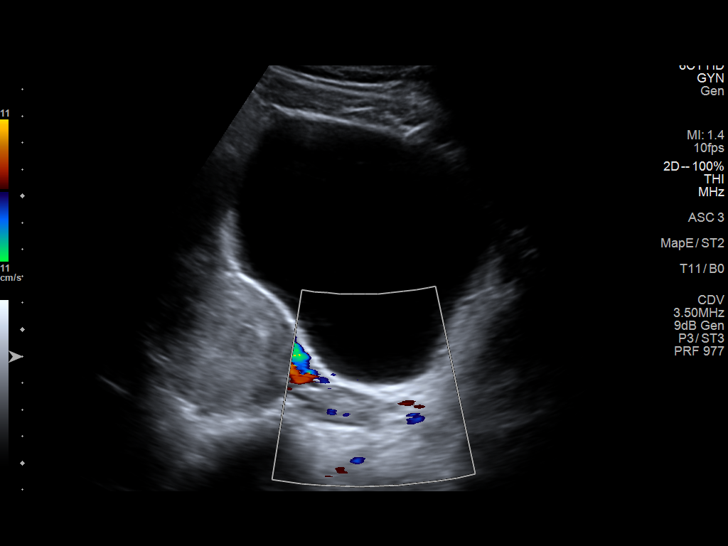
[im 23/56]
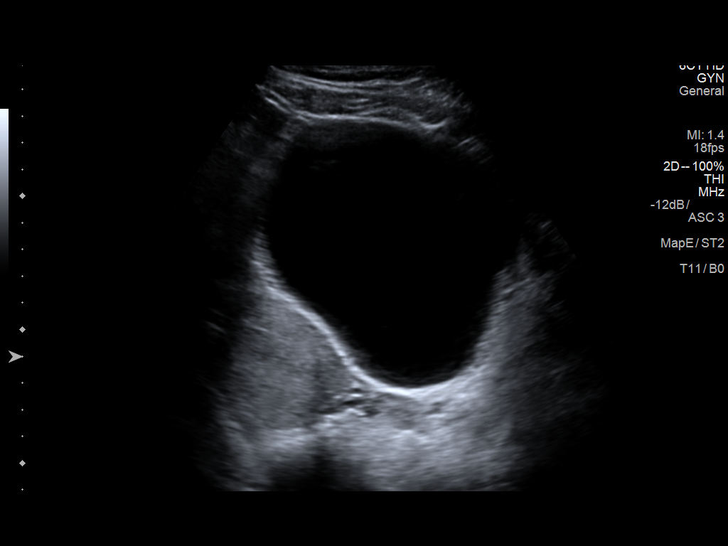
[im 28/56]
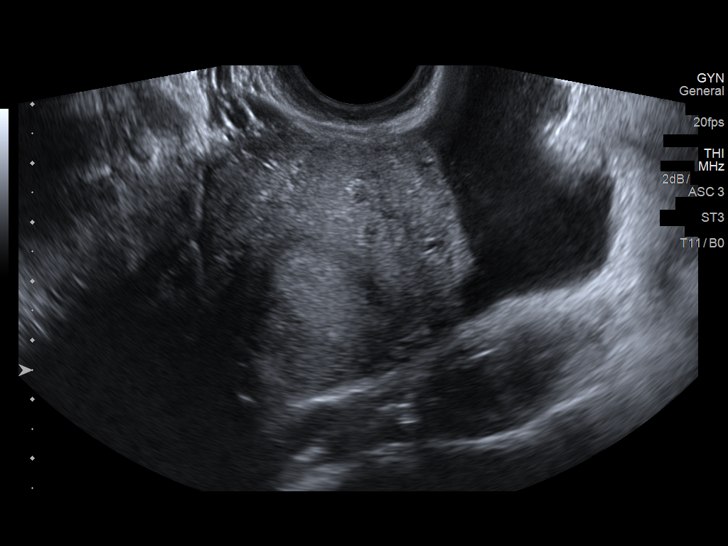
[im 33/56]
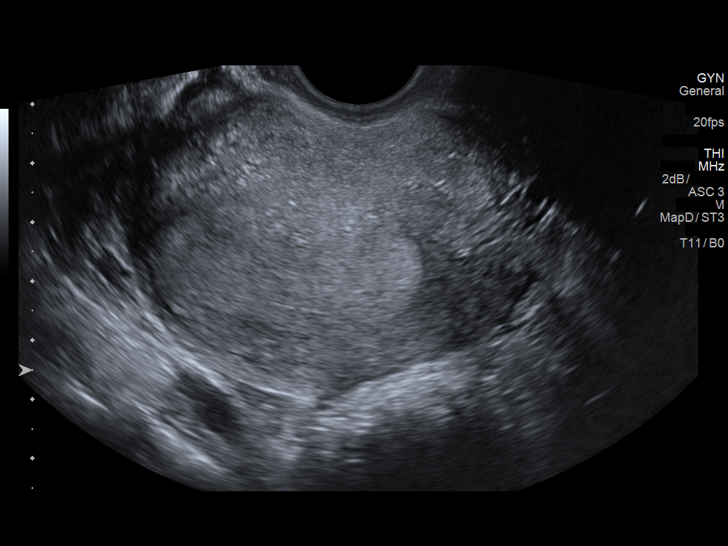
[im 37/56]
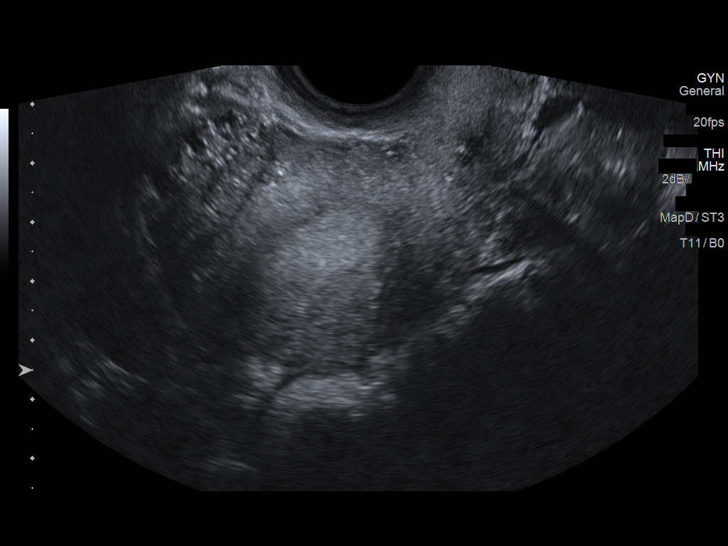
[im 42/56]
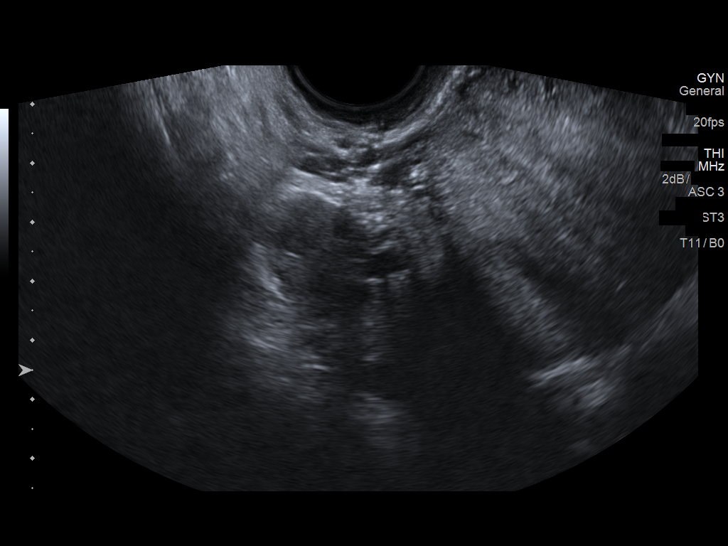
[im 46/56]
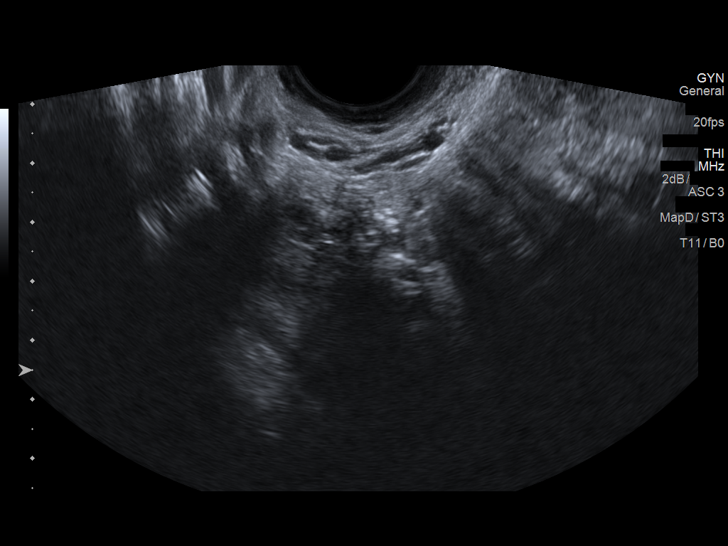
[im 51/56]
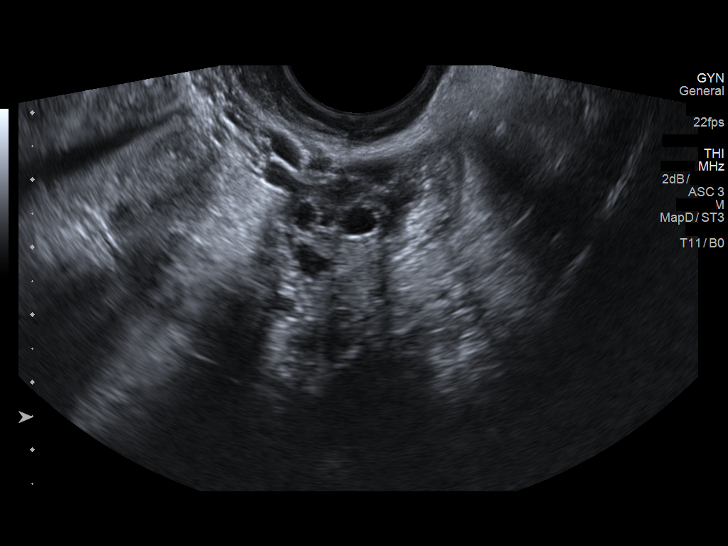
[im 56/56]
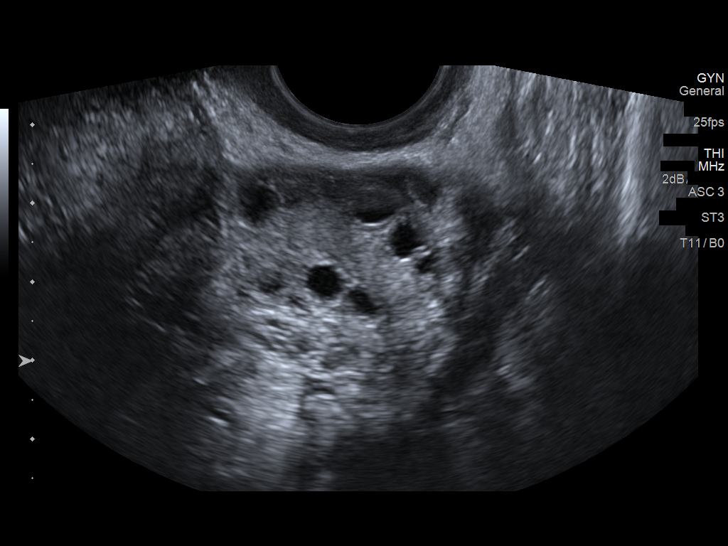

[13 of 25 positions shown; findings below may reference images not displayed]

FINDINGS: Uterus

Measurements: 7.1 x 5.3 x 6.4 cm. No fibroids or other mass
visualized.

Endometrium

Thickness: 16.7 mm.  No focal abnormality visualized.

Right ovary

Measurements: 3.8 x 2.6 x 2.5 cm. Normal appearance/no adnexal mass.

Left ovary

Measurements: 3.1 x 1.9 x 3.2 cm. Normal appearance/no adnexal mass.

Other findings

Moderate volume free fluid within the pelvis, likely physiologic.
IMPRESSION: 1. Moderate volume free fluid within the pelvis, likely physiologic.
2. Endometrial stripe measures 16.7 mm without focal abnormality,
consistent with secretory phase endometrium.
3. Otherwise normal sonographic appearance of the uterus and
ovaries. No adnexal mass.
